# Patient Record
Sex: Male | Born: 1935 | Race: White | Hispanic: No | Marital: Married | Smoking: Former smoker
Health system: Southern US, Community
[De-identification: ages and names within clinical notes are randomized; demographics above are authoritative.]

## PROBLEM LIST (undated history)

## (undated) DIAGNOSIS — K219 Gastro-esophageal reflux disease without esophagitis: Secondary | ICD-10-CM

## (undated) DIAGNOSIS — K7460 Unspecified cirrhosis of liver: Secondary | ICD-10-CM

## (undated) DIAGNOSIS — I10 Essential (primary) hypertension: Secondary | ICD-10-CM

## (undated) DIAGNOSIS — E119 Type 2 diabetes mellitus without complications: Secondary | ICD-10-CM

## (undated) DIAGNOSIS — J45909 Unspecified asthma, uncomplicated: Secondary | ICD-10-CM

## (undated) DIAGNOSIS — E785 Hyperlipidemia, unspecified: Secondary | ICD-10-CM

## (undated) DIAGNOSIS — B1920 Unspecified viral hepatitis C without hepatic coma: Secondary | ICD-10-CM

## (undated) DIAGNOSIS — N189 Chronic kidney disease, unspecified: Secondary | ICD-10-CM

## (undated) HISTORY — DX: Unspecified asthma, uncomplicated: J45.909

## (undated) HISTORY — DX: Unspecified cirrhosis of liver: K74.60

## (undated) HISTORY — PX: COLONOSCOPY: SHX174

## (undated) HISTORY — DX: Chronic kidney disease, unspecified: N18.9

## (undated) HISTORY — DX: Gastro-esophageal reflux disease without esophagitis: K21.9

## (undated) HISTORY — DX: Hyperlipidemia, unspecified: E78.5

## (undated) HISTORY — DX: Essential (primary) hypertension: I10

## (undated) HISTORY — PX: OTHER SURGICAL HISTORY: SHX169

## (undated) HISTORY — DX: Unspecified viral hepatitis C without hepatic coma: B19.20

---

## 2004-12-05 ENCOUNTER — Encounter: Admission: RE | Admit: 2004-12-05 | Discharge: 2004-12-25 | Payer: Self-pay | Admitting: Orthopedic Surgery

## 2006-07-17 ENCOUNTER — Encounter: Payer: Self-pay | Admitting: Cardiovascular Disease

## 2006-08-12 ENCOUNTER — Encounter: Admission: RE | Admit: 2006-08-12 | Discharge: 2006-10-03 | Payer: Self-pay | Admitting: Orthopedic Surgery

## 2007-07-17 ENCOUNTER — Ambulatory Visit (HOSPITAL_COMMUNITY): Admission: RE | Admit: 2007-07-17 | Discharge: 2007-07-17 | Payer: Self-pay | Admitting: Internal Medicine

## 2007-10-07 ENCOUNTER — Ambulatory Visit (HOSPITAL_COMMUNITY): Admission: RE | Admit: 2007-10-07 | Discharge: 2007-10-08 | Payer: Self-pay | Admitting: Neurosurgery

## 2009-11-01 ENCOUNTER — Inpatient Hospital Stay (HOSPITAL_COMMUNITY): Admission: RE | Admit: 2009-11-01 | Discharge: 2009-11-04 | Payer: Self-pay | Admitting: Orthopedic Surgery

## 2009-11-28 ENCOUNTER — Encounter: Admission: RE | Admit: 2009-11-28 | Discharge: 2010-02-26 | Payer: Self-pay | Admitting: Orthopedic Surgery

## 2010-01-25 ENCOUNTER — Ambulatory Visit (HOSPITAL_COMMUNITY): Admission: RE | Admit: 2010-01-25 | Discharge: 2010-01-25 | Payer: Self-pay | Admitting: Neurosurgery

## 2010-02-08 DEATH — deceased

## 2010-03-09 ENCOUNTER — Inpatient Hospital Stay (HOSPITAL_COMMUNITY): Admission: RE | Admit: 2010-03-09 | Discharge: 2010-03-13 | Payer: Self-pay | Admitting: Neurosurgery

## 2010-08-23 LAB — BASIC METABOLIC PANEL
CO2: 26 mEq/L (ref 19–32)
Creatinine, Ser: 1.11 mg/dL (ref 0.4–1.5)
GFR calc Af Amer: >60 mL/min (ref >60)
Glucose, Bld: 149 mg/dL — ABNORMAL HIGH (ref 70–99)
Potassium: 4.2 mEq/L (ref 3.5–5.1)

## 2010-08-23 LAB — GLUCOSE, CAPILLARY
Glucose-Capillary: 132 mg/dL — ABNORMAL HIGH (ref 70–99)
Glucose-Capillary: 147 mg/dL — ABNORMAL HIGH (ref 70–99)
Glucose-Capillary: 170 mg/dL — ABNORMAL HIGH (ref 70–99)

## 2010-08-23 LAB — CBC
HCT: 41.6 % (ref 39.0–52.0)
Hemoglobin: 13.6 g/dL (ref 13.0–17.0)
MCH: 28.5 pg (ref 26.0–34.0)
MCHC: 32.7 g/dL (ref 30.0–36.0)
Platelets: 153 10*3/uL (ref 150–400)
RBC: 4.77 MIL/uL (ref 4.22–5.81)

## 2010-08-24 LAB — CREATININE, SERUM
Creatinine, Ser: 0.9 mg/dL (ref 0.4–1.5)
GFR calc Af Amer: >60 mL/min (ref >60)

## 2010-08-27 LAB — COMPREHENSIVE METABOLIC PANEL
ALT: 57 U/L — ABNORMAL HIGH (ref 0–53)
AST: 45 U/L — ABNORMAL HIGH (ref 0–37)
Albumin: 3.8 g/dL (ref 3.5–5.2)
Alkaline Phosphatase: 78 U/L (ref 39–117)
BUN: 16 mg/dL (ref 6–23)
Calcium: 9 mg/dL (ref 8.4–10.5)
Chloride: 104 mEq/L (ref 96–112)
GFR calc non Af Amer: >60 mL/min (ref >60)
Glucose, Bld: 129 mg/dL — ABNORMAL HIGH (ref 70–99)
Sodium: 137 mEq/L (ref 135–145)
Total Bilirubin: 0.8 mg/dL (ref 0.3–1.2)
Total Protein: 7 g/dL (ref 6.0–8.3)

## 2010-08-27 LAB — PROTIME-INR
INR: 1.09 (ref 0.00–1.49)
INR: 1.12 (ref 0.00–1.49)
Prothrombin Time: 14 seconds (ref 11.6–15.2)
Prothrombin Time: 14.3 seconds (ref 11.6–15.2)
Prothrombin Time: 16.6 seconds — ABNORMAL HIGH (ref 11.6–15.2)

## 2010-08-27 LAB — URINALYSIS, ROUTINE W REFLEX MICROSCOPIC
Bilirubin Urine: NEGATIVE
Hgb urine dipstick: NEGATIVE
Nitrite: NEGATIVE
Protein, ur: NEGATIVE mg/dL
Urobilinogen, UA: 0.2 mg/dL (ref 0.0–1.0)

## 2010-08-27 LAB — CBC
HCT: 32.9 % — ABNORMAL LOW (ref 39.0–52.0)
HCT: 35.5 % — ABNORMAL LOW (ref 39.0–52.0)
Hemoglobin: 11.2 g/dL — ABNORMAL LOW (ref 13.0–17.0)
Hemoglobin: 12.7 g/dL — ABNORMAL LOW (ref 13.0–17.0)
MCHC: 33.8 g/dL (ref 30.0–36.0)
MCHC: 33.9 g/dL (ref 30.0–36.0)
MCV: 92.9 fL (ref 78.0–100.0)
MCV: 92.9 fL (ref 78.0–100.0)
MCV: 93.8 fL (ref 78.0–100.0)
Platelets: 146 10*3/uL — ABNORMAL LOW (ref 150–400)
Platelets: 147 10*3/uL — ABNORMAL LOW (ref 150–400)
Platelets: 151 10*3/uL (ref 150–400)
RBC: 3.54 MIL/uL — ABNORMAL LOW (ref 4.22–5.81)
RBC: 4.02 MIL/uL — ABNORMAL LOW (ref 4.22–5.81)
RBC: 4.86 MIL/uL (ref 4.22–5.81)
RDW: 13.9 % (ref 11.5–15.5)
RDW: 14.1 % (ref 11.5–15.5)
WBC: 6.3 10*3/uL (ref 4.0–10.5)
WBC: 7.8 10*3/uL (ref 4.0–10.5)
WBC: 9.3 10*3/uL (ref 4.0–10.5)

## 2010-08-27 LAB — GLUCOSE, CAPILLARY
Glucose-Capillary: 119 mg/dL — ABNORMAL HIGH (ref 70–99)
Glucose-Capillary: 128 mg/dL — ABNORMAL HIGH (ref 70–99)
Glucose-Capillary: 136 mg/dL — ABNORMAL HIGH (ref 70–99)
Glucose-Capillary: 146 mg/dL — ABNORMAL HIGH (ref 70–99)
Glucose-Capillary: 152 mg/dL — ABNORMAL HIGH (ref 70–99)
Glucose-Capillary: 152 mg/dL — ABNORMAL HIGH (ref 70–99)
Glucose-Capillary: 165 mg/dL — ABNORMAL HIGH (ref 70–99)

## 2010-08-27 LAB — BASIC METABOLIC PANEL
BUN: 9 mg/dL (ref 6–23)
CO2: 31 mEq/L (ref 19–32)
CO2: 32 mEq/L (ref 19–32)
Calcium: 8.5 mg/dL (ref 8.4–10.5)
Chloride: 103 mEq/L (ref 96–112)
Chloride: 98 mEq/L (ref 96–112)
Creatinine, Ser: 1.06 mg/dL (ref 0.4–1.5)
Creatinine, Ser: 1.11 mg/dL (ref 0.4–1.5)
GFR calc Af Amer: >60 mL/min (ref >60)
Glucose, Bld: 153 mg/dL — ABNORMAL HIGH (ref 70–99)
Potassium: 4.2 mEq/L (ref 3.5–5.1)
Sodium: 139 mEq/L (ref 135–145)

## 2010-08-27 LAB — TYPE AND SCREEN
ABO/RH(D): O POS
Antibody Screen: NEGATIVE

## 2010-08-27 LAB — ABO/RH: ABO/RH(D): O POS

## 2010-10-23 NOTE — Op Note (Signed)
NAMEPHARAOH, PIO NO.:  [id number]   MEDICAL RECORD NO.:  [id number]          PATIENT TYPE:  INP   LOCATION:  3528                         FACILITY:  [facility]   PHYSICIAN:  Coletta Memos, M.D.     DATE OF BIRTH:  [birth year]   DATE OF PROCEDURE:  10/07/2007  DATE OF DISCHARGE:                               OPERATIVE REPORT   PREOPERATIVE DIAGNOSES:  1. Lumbar stenosis, L3-L4,  L4-L5.  2. Lumbar displaced disk, L3-L4.   POSTOPERATIVE DIAGNOSIS:  Lumbar stenosis, L3-L4, L4-L5.   PROCEDURE:  L4 laminectomy; hemilaminectomies L3; hemilaminectomy L5;  decompression of L3, L4, and L5 nerve roots.   COMPLICATIONS:  None.   SURGEON:  Coletta Memos, MD   ASSISTANT:  Hewitt Shorts, MD   INDICATIONS:  Mr. Meenach presented to my office yesterday in severe  pain.  He was being pushed in a wheelchair by his wife.  He had severe  stenosis at L3-L4 and L4-L5 and what I felt was a fragment of disk at L3-  L4 on the right side.  I therefore recommended and he agreed to go to  the operating room today.   OPERATIVE NOTE:  Mr. Hernon was brought to the operating room,  intubated, and placed under a general anesthetic without difficulty.  He  was rolled prone onto a Wilson frame and all pressure points were  properly padded.  His back was prepped and he was draped in a sterile  fashion.  I infiltrated 0.5% lidocaine with 1:200,000 strength of  epinephrine into the paraspinous musculature.  I opened the skin and  took the initial incision down through the subcutaneous fats to the  thoracolumbar fascia.  I exposed the lamina of the L2, L3, L4, and L5.  I took an intraoperative x-ray to confirm my location, then I was  actually underneath the L3 lamina, so I then exposed the L5 lamina.  I  then proceeded with a complete laminectomy of L4 removing very thick  ligamentum flavum.  I decompressed the spinal canal in the L4 and L5  roots.  I then performed hemilaminectomies of  L3 and of L5 further  decompressing the spinal canal and thecal sac.  I with Dr. Earl Gala  assistance then inspected the disk at L3-L4, searching for fragmented  disk.  He did not appreciate one nor did I appreciate any compression of  the nerve roots at the L3-L4 level.  I therefore achieved hemostasis.  I  then irrigated.  I closed the wound in layered fashion using Vicryl  sutures to reapproximate the thoracolumbar fascia, subcutaneous, and  subcuticular layers.           ______________________________  Coletta Memos, M.D.     KC/MEDQ  D:  10/07/2007  T:  10/08/2007  Job:  [id number]

## 2011-02-24 ENCOUNTER — Encounter: Payer: Self-pay | Admitting: Cardiovascular Disease

## 2011-03-05 LAB — CBC
Hemoglobin: 17
MCV: 91.3
RBC: 5.38
WBC: 8.1

## 2011-03-05 LAB — BASIC METABOLIC PANEL
Chloride: 98
Creatinine, Ser: 1.37
GFR calc Af Amer: >60
Sodium: 138

## 2011-03-05 LAB — HEPATIC FUNCTION PANEL
ALT: 48
Albumin: 3.9
Alkaline Phosphatase: 66
Total Bilirubin: 0.7
Total Protein: 7.3

## 2013-01-29 ENCOUNTER — Encounter: Payer: Self-pay | Admitting: Cardiology

## 2013-02-18 ENCOUNTER — Ambulatory Visit (INDEPENDENT_AMBULATORY_CARE_PROVIDER_SITE_OTHER): Payer: Medicare Other | Admitting: Cardiovascular Disease

## 2013-02-18 ENCOUNTER — Encounter: Payer: Self-pay | Admitting: *Deleted

## 2013-02-18 ENCOUNTER — Encounter: Payer: Self-pay | Admitting: Cardiovascular Disease

## 2013-02-18 VITALS — BP 135/80 | HR 78 | Ht 65.0 in | Wt 268.8 lb

## 2013-02-18 DIAGNOSIS — M7989 Other specified soft tissue disorders: Secondary | ICD-10-CM

## 2013-02-18 DIAGNOSIS — G4733 Obstructive sleep apnea (adult) (pediatric): Secondary | ICD-10-CM | POA: Insufficient documentation

## 2013-02-18 DIAGNOSIS — R0609 Other forms of dyspnea: Secondary | ICD-10-CM

## 2013-02-18 DIAGNOSIS — Z794 Long term (current) use of insulin: Secondary | ICD-10-CM

## 2013-02-18 DIAGNOSIS — I10 Essential (primary) hypertension: Secondary | ICD-10-CM

## 2013-02-18 DIAGNOSIS — J449 Chronic obstructive pulmonary disease, unspecified: Secondary | ICD-10-CM

## 2013-02-18 DIAGNOSIS — E119 Type 2 diabetes mellitus without complications: Secondary | ICD-10-CM

## 2013-02-18 DIAGNOSIS — R0602 Shortness of breath: Secondary | ICD-10-CM

## 2013-02-18 NOTE — Patient Instructions (Signed)
Your physician has requested that you have an echocardiogram. Echocardiography is a painless test that uses sound waves to create images of your heart. It provides your doctor with information about the size and shape of your heart and how well your heart's chambers and valves are working. This procedure takes approximately one hour. There are no restrictions for this procedure. Your physician has requested that you have a lexiscan myoview. For further information please visit [email or web address] Please follow instruction sheet, as given. Office will contact with results via phone or letter.   Continue all current medications. Follow up in  4-6 weeks

## 2013-02-18 NOTE — Progress Notes (Signed)
Patient ID: Cody Schmitt, male   DOB: 08-13-1934, 78 y.o.   MRN: [id number]    CARDIOLOGY CONSULT NOTE  Patient ID: Cody Schmitt MRN: [id number] DOB/AGE: 09-Sep-1934 78 y.o.  Primary Physician: Cody Specking, MD  Reason for Consultation: DOE and leg swelling  HPI: Mr. Cody Schmitt has a h/o COPD, IDDM, and OSA (uses CPAP), and has been having progressive dyspnea with exertion, with associated leg swelling. He had normal venous Dopplers in 2008 and had a positive sleep study in 2012, c/w COPD with obstructive sleep apnea. An ECG done in the office today is unremarkable.  He's been having increasing DOE since he put on weight. He's had bilateral knee replacement and two back surgeries and due to this, he's not had much activity. He denies chest pain. He recently started walking on a treadmill.  He denies a h/o heart disease. He denies lightheadedness and dizziness. He's only experienced one episode of palpitations while mowing the lawn, which resolved with rest.  He's begun using his nebulizer more in the last few weeks along with his inhaler, and he says his "breathing has improved 100%".   He appears to underestimate the significance of his SOB, as his wife has noticed significant shortness of breath when he walks.  I reviewed his recent blood tests, and TSH and Hgb were normal, with a creatinine of 1.28 and BUN of 19.   SocHx: married for 23 years, quit smoking in 1984.   Allergies  Allergen Reactions  . Coumadin [Warfarin Sodium]   . Dilaudid [Hydromorphone Hcl]   . Lisinopril   . Statins   . Zetia [Ezetimibe]     Current Outpatient Prescriptions  Medication Sig Dispense Refill  . albuterol (PROVENTIL) (2.5 MG/3ML) 0.083% nebulizer solution Take 2.5 mg by nebulization every 6 (six) hours as needed for wheezing.      Marland Kitchen alendronate (FOSAMAX) 70 MG tablet Take 70 mg by mouth every 7 (seven) days. Take with a full glass of water on an empty stomach.      Marland Kitchen amoxicillin (AMOXIL) 500  MG capsule Take 2,000 mg by mouth as needed. Take 4 tablets 1 hour prior to dental procedure      . aspirin 81 MG tablet Take 81 mg by mouth daily.      . clotrimazole-betamethasone (LOTRISONE) cream Apply topically 2 (two) times daily.      . diazepam (VALIUM) 5 MG tablet Take 5 mg by mouth every 6 (six) hours as needed for anxiety.      . diclofenac (VOLTAREN) 75 MG EC tablet Take 75 mg by mouth 2 (two) times daily.      . formoterol (FORADIL) 12 MCG capsule for inhaler Place 12 mcg into inhaler and inhale 2 (two) times daily.      . hydrochlorothiazide (HYDRODIURIL) 25 MG tablet Take 25 mg by mouth daily.      . insulin glargine (LANTUS) 100 UNIT/ML injection Inject 20 Units into the skin at bedtime.      Marland Kitchen ipratropium (ATROVENT HFA) 17 MCG/ACT inhaler Inhale 2 puffs into the lungs every 6 (six) hours.      Marland Kitchen ipratropium (ATROVENT) 0.02 % nebulizer solution Take 500 mcg by nebulization 4 (four) times daily.      Marland Kitchen loratadine (CLARITIN) 10 MG tablet Take 10 mg by mouth daily.      Marland Kitchen losartan (COZAAR) 100 MG tablet Take 100 mg by mouth daily.      . metFORMIN (GLUCOPHAGE) 500 MG tablet Take  500-1,000 mg by mouth 2 (two) times daily with a meal. Take 2 tablets in AM and 1 tablet in PM      . metoprolol succinate (TOPROL-XL) 25 MG 24 hr tablet Take 25 mg by mouth daily.      . mometasone (ASMANEX) 220 MCG/INH inhaler Inhale 2 puffs into the lungs daily.      . nalbuphine (NUBAIN) 20 MG/ML injection Inject into the vein every 3 (three) hours as needed.      . naproxen sodium (ANAPROX) 220 MG tablet Take 220 mg by mouth 2 (two) times daily with a meal.      . oxazepam (SERAX) 10 MG capsule Take 10 mg by mouth at bedtime as needed for sleep or anxiety.      . predniSONE (DELTASONE) 10 MG tablet Take 10 mg by mouth daily.      . promethazine (PHENERGAN) 25 MG/ML injection Inject into the vein once.      . QUININE SULFATE PO Take by mouth.      . tadalafil (CIALIS) 5 MG tablet Take 5 mg by mouth daily  as needed for erectile dysfunction.      . triamcinolone cream (KENALOG) 0.1 % Apply topically 2 (two) times daily.       No current facility-administered medications for this visit.    Past Medical History  Diagnosis Date  . HTN (hypertension)   . Asthma   . Hepatitis C   . Dyslipidemia   . GERD (gastroesophageal reflux disease)     Past Surgical History  Procedure Laterality Date  . Colonoscopy    . Cataract surgery      History   Social History  . Marital Status: Married    Spouse Name: N/A    Number of Children: N/A  . Years of Education: N/A   Occupational History  . Not on file.   Social History Main Topics  . Smoking status: Not on file  . Smokeless tobacco: Not on file  . Alcohol Use: Not on file  . Drug Use: Not on file  . Sexual Activity: Not on file   Other Topics Concern  . Not on file   Social History Narrative  . No narrative on file     FamHx: non-contributory  Review of systems complete and found to be negative unless listed above in HPI     Physical exam  BP: 135/80, HR: 78 bpm  General: NAD Neck: No JVD, no thyromegaly or thyroid nodule.  Lungs: Clear to auscultation bilaterally with normal respiratory effort. CV: Nondisplaced PMI.  Heart regular S1/S2, no S3/S4, no murmur.  No peripheral edema.  No carotid bruit.  Normal pedal pulses.  Abdomen: Soft, nontender, no hepatosplenomegaly, no distention.  Skin: Intact without lesions or rashes.  Neurologic: Alert and oriented x 3.  Psych: Normal affect. Extremities: No clubbing or cyanosis.  HEENT: Normal.   Labs:   Lab Results  Component Value Date   WBC 7.1 03/09/2010   HGB 13.6 03/09/2010   HCT 41.6 03/09/2010   MCV 87.2 03/09/2010   PLT 153 03/09/2010   No results found for this basename: NA, K, CL, CO2, BUN, CREATININE, CALCIUM, LABALBU, PROT, BILITOT, ALKPHOS, ALT, AST, GLUCOSE,  in the last 168 hours No results found for this basename: CKTOTAL, CKMB, CKMBINDEX, TROPONINI     No results found for this basename: CHOL   No results found for this basename: HDL   No results found for this basename: LDLCALC  No results found for this basename: TRIG   No results found for this basename: CHOLHDL   No results found for this basename: LDLDIRECT       EKG: Sinus rhythm, rate 78 bpm, axis within normal limits, intervals within normal limits, no acute ST-T wave changes.  Studies:   ASSESSMENT AND PLAN:  1. Dyspnea on exertion: given that this has appeared to have progressed (at least per his wife), and with his risk factors being HTN, hyperlipidemia, and IDDM, I will obtain an echocardiogram to evaluate for structural heart disease, and a Lexiscan Myoview stress test to evaluate for occult ischemia, to see if this represents an anginal equivalent. 2. HTN: controlled on HCTZ. 3. Hyperlipidemia: on red rice yeast extract.  Signed: Prentice Docker, M.D., F.A.C.C. 02/18/2013, 1:30 PM

## 2013-02-19 ENCOUNTER — Telehealth: Payer: Self-pay | Admitting: Cardiovascular Disease

## 2013-02-19 NOTE — Telephone Encounter (Signed)
Cody Schmitt wants to know about stopping his ASA before his stress test.

## 2013-02-19 NOTE — Telephone Encounter (Signed)
Patient advised that he can take his ASA.  Hold Insulin & Metformin morning of test as previously instructed.  Patient verbalized understanding.

## 2013-02-25 ENCOUNTER — Other Ambulatory Visit (INDEPENDENT_AMBULATORY_CARE_PROVIDER_SITE_OTHER): Payer: Medicare Other

## 2013-02-25 ENCOUNTER — Other Ambulatory Visit: Payer: Self-pay

## 2013-02-25 DIAGNOSIS — R0609 Other forms of dyspnea: Secondary | ICD-10-CM

## 2013-02-25 DIAGNOSIS — M7989 Other specified soft tissue disorders: Secondary | ICD-10-CM

## 2013-02-25 DIAGNOSIS — I10 Essential (primary) hypertension: Secondary | ICD-10-CM

## 2013-02-25 DIAGNOSIS — R0602 Shortness of breath: Secondary | ICD-10-CM

## 2013-03-02 ENCOUNTER — Encounter (HOSPITAL_COMMUNITY)
Admission: RE | Admit: 2013-03-02 | Discharge: 2013-03-02 | Disposition: A | Discharge status: Home / Self Care | Payer: Medicare Other | Source: Ambulatory Visit | Attending: Cardiovascular Disease | Admitting: Cardiovascular Disease

## 2013-03-02 ENCOUNTER — Encounter (HOSPITAL_COMMUNITY): Payer: Self-pay

## 2013-03-02 DIAGNOSIS — I10 Essential (primary) hypertension: Secondary | ICD-10-CM | POA: Insufficient documentation

## 2013-03-02 DIAGNOSIS — M7989 Other specified soft tissue disorders: Secondary | ICD-10-CM

## 2013-03-02 DIAGNOSIS — R0602 Shortness of breath: Secondary | ICD-10-CM | POA: Insufficient documentation

## 2013-03-02 DIAGNOSIS — R0989 Other specified symptoms and signs involving the circulatory and respiratory systems: Secondary | ICD-10-CM | POA: Insufficient documentation

## 2013-03-02 DIAGNOSIS — E119 Type 2 diabetes mellitus without complications: Secondary | ICD-10-CM | POA: Insufficient documentation

## 2013-03-02 DIAGNOSIS — Z794 Long term (current) use of insulin: Secondary | ICD-10-CM | POA: Insufficient documentation

## 2013-03-02 DIAGNOSIS — R0609 Other forms of dyspnea: Secondary | ICD-10-CM

## 2013-03-02 HISTORY — DX: Type 2 diabetes mellitus without complications: E11.9

## 2013-03-02 MED ORDER — TECHNETIUM TC 99M SESTAMIBI - CARDIOLITE
10.0000 | Freq: Once | INTRAVENOUS | Status: AC | PRN
  Administered 2013-03-02: 10 via INTRAVENOUS

## 2013-03-02 MED ORDER — SODIUM CHLORIDE 0.9 % IJ SOLN
INTRAMUSCULAR | Status: AC
  Administered 2013-03-02: 10 mL via INTRAVENOUS
  Filled 2013-03-02: qty 10

## 2013-03-02 MED ORDER — REGADENOSON 0.4 MG/5ML IV SOLN
INTRAVENOUS | Status: AC
  Administered 2013-03-02: 0.4 mg via INTRAVENOUS
  Filled 2013-03-02: qty 5

## 2013-03-02 MED ORDER — TECHNETIUM TC 99M SESTAMIBI - CARDIOLITE
30.0000 | Freq: Once | INTRAVENOUS | Status: AC | PRN
  Administered 2013-03-02: 11:00:00 30 via INTRAVENOUS

## 2013-03-02 NOTE — Progress Notes (Signed)
Stress Lab Nurses Notes - Cody Schmitt  Cody Schmitt 03/02/2013 Reason for doing test: Dyspnea Type of test: Marlane Hatcher Nurse performing test: Parke Poisson, RN Nuclear Medicine Tech: Lyndel Pleasure Echo Tech: Not Applicable MD performing test: Dr. Purvis Sheffield / Joni Reining NP Family MD: Dr. Sherril Croon Test explained and consent signed: yes IV started: 22g jelco, Saline lock flushed, No redness or edema and Saline lock started in radiology Symptoms: SOB & stomach discomfort Treatment/Intervention: None Reason test stopped: protocol completed After recovery IV was: Discontinued via X-ray tech and No redness or edema Patient to return to Nuc. Med at : 11:45 Patient discharged: Home Patient's Condition upon discharge was: stable Comments: During test BP 162/58 & HR 92.   Recovery BP 147/62 & HR 81 .  Symptoms resolved in recovery. Erskine Speed T

## 2013-03-03 ENCOUNTER — Telehealth: Payer: Self-pay | Admitting: *Deleted

## 2013-03-03 NOTE — Telephone Encounter (Signed)
STRESS TEST --  Notes Recorded by Laqueta Linden, MD on 03/03/2013 at 8:56 AM Please inform pt of normal results.  ECHO --  Notes Recorded by Laqueta Linden, MD on 02/26/2013 at 4:13 PM Await results of stress test and will discuss at OV.  That's ok. Can f/u with PCP. Stress test was low-risk and he has normal LV systolic function. Likely lungs causing his shortness of breath. -----   Message ----- From: Lesle Chris, LPN Sent: 0/98/1191 4:05 PM To: Laqueta Linden, MD   When do you want this patient to be scheduled for follow up? Did not have a visit scheduled.

## 2013-03-03 NOTE — Telephone Encounter (Signed)
Patient notified of Echo & stress test.  Will forward copy to PMD (Vyas).

## 2014-10-10 ENCOUNTER — Telehealth: Payer: Self-pay | Admitting: Internal Medicine

## 2014-10-10 ENCOUNTER — Inpatient Hospital Stay (HOSPITAL_COMMUNITY): Payer: Medicare Other

## 2014-10-10 ENCOUNTER — Inpatient Hospital Stay (HOSPITAL_COMMUNITY)
Admission: AD | Admit: 2014-10-10 | Discharge: 2014-10-28 | DRG: 871 | Disposition: A | Discharge status: Skilled Nursing Facility | Payer: Medicare Other | Source: Other Acute Inpatient Hospital | Attending: Internal Medicine | Admitting: Internal Medicine

## 2014-10-10 DIAGNOSIS — A419 Sepsis, unspecified organism: Secondary | ICD-10-CM | POA: Diagnosis present

## 2014-10-10 DIAGNOSIS — I129 Hypertensive chronic kidney disease with stage 1 through stage 4 chronic kidney disease, or unspecified chronic kidney disease: Secondary | ICD-10-CM | POA: Diagnosis not present

## 2014-10-10 DIAGNOSIS — D649 Anemia, unspecified: Secondary | ICD-10-CM | POA: Diagnosis present

## 2014-10-10 DIAGNOSIS — Z794 Long term (current) use of insulin: Secondary | ICD-10-CM | POA: Diagnosis not present

## 2014-10-10 DIAGNOSIS — J449 Chronic obstructive pulmonary disease, unspecified: Secondary | ICD-10-CM | POA: Diagnosis not present

## 2014-10-10 DIAGNOSIS — J45909 Unspecified asthma, uncomplicated: Secondary | ICD-10-CM | POA: Diagnosis not present

## 2014-10-10 DIAGNOSIS — Z8639 Personal history of other endocrine, nutritional and metabolic disease: Secondary | ICD-10-CM | POA: Diagnosis not present

## 2014-10-10 DIAGNOSIS — G9340 Encephalopathy, unspecified: Secondary | ICD-10-CM | POA: Diagnosis present

## 2014-10-10 DIAGNOSIS — I248 Other forms of acute ischemic heart disease: Secondary | ICD-10-CM | POA: Diagnosis present

## 2014-10-10 DIAGNOSIS — Z87891 Personal history of nicotine dependence: Secondary | ICD-10-CM | POA: Diagnosis not present

## 2014-10-10 DIAGNOSIS — R443 Hallucinations, unspecified: Secondary | ICD-10-CM | POA: Diagnosis not present

## 2014-10-10 DIAGNOSIS — J690 Pneumonitis due to inhalation of food and vomit: Secondary | ICD-10-CM | POA: Diagnosis not present

## 2014-10-10 DIAGNOSIS — R109 Unspecified abdominal pain: Secondary | ICD-10-CM

## 2014-10-10 DIAGNOSIS — R451 Restlessness and agitation: Secondary | ICD-10-CM | POA: Diagnosis present

## 2014-10-10 DIAGNOSIS — E785 Hyperlipidemia, unspecified: Secondary | ICD-10-CM | POA: Diagnosis not present

## 2014-10-10 DIAGNOSIS — K219 Gastro-esophageal reflux disease without esophagitis: Secondary | ICD-10-CM | POA: Diagnosis not present

## 2014-10-10 DIAGNOSIS — Z888 Allergy status to other drugs, medicaments and biological substances status: Secondary | ICD-10-CM

## 2014-10-10 DIAGNOSIS — E874 Mixed disorder of acid-base balance: Secondary | ICD-10-CM | POA: Diagnosis present

## 2014-10-10 DIAGNOSIS — R4182 Altered mental status, unspecified: Secondary | ICD-10-CM | POA: Diagnosis present

## 2014-10-10 DIAGNOSIS — M7989 Other specified soft tissue disorders: Secondary | ICD-10-CM | POA: Diagnosis not present

## 2014-10-10 DIAGNOSIS — E876 Hypokalemia: Secondary | ICD-10-CM | POA: Diagnosis not present

## 2014-10-10 DIAGNOSIS — Z885 Allergy status to narcotic agent status: Secondary | ICD-10-CM | POA: Diagnosis not present

## 2014-10-10 DIAGNOSIS — I10 Essential (primary) hypertension: Secondary | ICD-10-CM | POA: Diagnosis not present

## 2014-10-10 DIAGNOSIS — E870 Hyperosmolality and hypernatremia: Secondary | ICD-10-CM | POA: Diagnosis not present

## 2014-10-10 DIAGNOSIS — R579 Shock, unspecified: Secondary | ICD-10-CM | POA: Diagnosis not present

## 2014-10-10 DIAGNOSIS — E1142 Type 2 diabetes mellitus with diabetic polyneuropathy: Secondary | ICD-10-CM | POA: Diagnosis not present

## 2014-10-10 DIAGNOSIS — Z79899 Other long term (current) drug therapy: Secondary | ICD-10-CM | POA: Diagnosis not present

## 2014-10-10 DIAGNOSIS — R410 Disorientation, unspecified: Secondary | ICD-10-CM | POA: Diagnosis not present

## 2014-10-10 DIAGNOSIS — R609 Edema, unspecified: Secondary | ICD-10-CM

## 2014-10-10 DIAGNOSIS — R6520 Severe sepsis without septic shock: Secondary | ICD-10-CM | POA: Diagnosis not present

## 2014-10-10 DIAGNOSIS — K760 Fatty (change of) liver, not elsewhere classified: Secondary | ICD-10-CM | POA: Diagnosis not present

## 2014-10-10 DIAGNOSIS — R5381 Other malaise: Secondary | ICD-10-CM

## 2014-10-10 DIAGNOSIS — Z452 Encounter for adjustment and management of vascular access device: Secondary | ICD-10-CM

## 2014-10-10 DIAGNOSIS — J9601 Acute respiratory failure with hypoxia: Secondary | ICD-10-CM | POA: Diagnosis not present

## 2014-10-10 DIAGNOSIS — Z7982 Long term (current) use of aspirin: Secondary | ICD-10-CM | POA: Diagnosis not present

## 2014-10-10 DIAGNOSIS — K7290 Hepatic failure, unspecified without coma: Secondary | ICD-10-CM | POA: Diagnosis not present

## 2014-10-10 DIAGNOSIS — G4733 Obstructive sleep apnea (adult) (pediatric): Secondary | ICD-10-CM | POA: Diagnosis not present

## 2014-10-10 DIAGNOSIS — N183 Chronic kidney disease, stage 3 unspecified: Secondary | ICD-10-CM | POA: Diagnosis present

## 2014-10-10 DIAGNOSIS — J810 Acute pulmonary edema: Secondary | ICD-10-CM | POA: Diagnosis not present

## 2014-10-10 DIAGNOSIS — N179 Acute kidney failure, unspecified: Secondary | ICD-10-CM | POA: Diagnosis not present

## 2014-10-10 DIAGNOSIS — IMO0001 Reserved for inherently not codable concepts without codable children: Secondary | ICD-10-CM

## 2014-10-10 DIAGNOSIS — R6521 Severe sepsis with septic shock: Secondary | ICD-10-CM | POA: Diagnosis present

## 2014-10-10 DIAGNOSIS — J9600 Acute respiratory failure, unspecified whether with hypoxia or hypercapnia: Secondary | ICD-10-CM | POA: Diagnosis not present

## 2014-10-10 DIAGNOSIS — D696 Thrombocytopenia, unspecified: Secondary | ICD-10-CM | POA: Diagnosis not present

## 2014-10-10 DIAGNOSIS — J9811 Atelectasis: Secondary | ICD-10-CM

## 2014-10-10 DIAGNOSIS — E119 Type 2 diabetes mellitus without complications: Secondary | ICD-10-CM

## 2014-10-10 DIAGNOSIS — Z4659 Encounter for fitting and adjustment of other gastrointestinal appliance and device: Secondary | ICD-10-CM

## 2014-10-10 DIAGNOSIS — R0600 Dyspnea, unspecified: Secondary | ICD-10-CM | POA: Diagnosis not present

## 2014-10-10 DIAGNOSIS — R0609 Other forms of dyspnea: Secondary | ICD-10-CM

## 2014-10-10 DIAGNOSIS — R768 Other specified abnormal immunological findings in serum: Secondary | ICD-10-CM

## 2014-10-10 DIAGNOSIS — J9690 Respiratory failure, unspecified, unspecified whether with hypoxia or hypercapnia: Secondary | ICD-10-CM | POA: Diagnosis present

## 2014-10-10 DIAGNOSIS — Z978 Presence of other specified devices: Secondary | ICD-10-CM

## 2014-10-10 LAB — PROTIME-INR
INR: 1.44 (ref 0.00–1.49)
PROTHROMBIN TIME: 17.7 s — AB (ref 11.6–15.2)

## 2014-10-10 LAB — RAPID URINE DRUG SCREEN, HOSP PERFORMED
Amphetamines: NOT DETECTED
Barbiturates: NOT DETECTED
Benzodiazepines: POSITIVE — AB
Cocaine: NOT DETECTED
Opiates: NOT DETECTED
Tetrahydrocannabinol: NOT DETECTED

## 2014-10-10 LAB — CBC WITH DIFFERENTIAL/PLATELET
BASOS PCT: 0 % (ref 0–1)
Basophils Absolute: 0 10*3/uL (ref 0.0–0.1)
EOS ABS: 0 10*3/uL (ref 0.0–0.7)
Eosinophils Relative: 0 % (ref 0–5)
HCT: 27.7 % — ABNORMAL LOW (ref 39.0–52.0)
Hemoglobin: 9.1 g/dL — ABNORMAL LOW (ref 13.0–17.0)
Lymphocytes Relative: 10 % — ABNORMAL LOW (ref 12–46)
Lymphs Abs: 1.1 10*3/uL (ref 0.7–4.0)
MCH: 27.9 pg (ref 26.0–34.0)
MCHC: 32.9 g/dL (ref 30.0–36.0)
MCV: 85 fL (ref 78.0–100.0)
MONOS PCT: 12 % (ref 3–12)
Monocytes Absolute: 1.4 10*3/uL — ABNORMAL HIGH (ref 0.1–1.0)
Neutro Abs: 8.8 10*3/uL — ABNORMAL HIGH (ref 1.7–7.7)
Neutrophils Relative %: 78 % — ABNORMAL HIGH (ref 43–77)
PLATELETS: 84 10*3/uL — AB (ref 150–400)
RBC: 3.26 MIL/uL — ABNORMAL LOW (ref 4.22–5.81)
RDW: 16 % — AB (ref 11.5–15.5)
WBC: 11.3 10*3/uL — ABNORMAL HIGH (ref 4.0–10.5)

## 2014-10-10 LAB — LACTIC ACID, PLASMA: LACTIC ACID, VENOUS: 1.5 mmol/L (ref 0.5–2.0)

## 2014-10-10 LAB — BRAIN NATRIURETIC PEPTIDE: B NATRIURETIC PEPTIDE 5: 287.5 pg/mL — AB (ref 0.0–100.0)

## 2014-10-10 LAB — LIPASE, BLOOD: Lipase: 21 U/L — ABNORMAL LOW (ref 22–51)

## 2014-10-10 LAB — COMPREHENSIVE METABOLIC PANEL
ALBUMIN: 2.3 g/dL — AB (ref 3.5–5.0)
ALK PHOS: 114 U/L (ref 38–126)
ALT: 40 U/L (ref 17–63)
ANION GAP: 5 (ref 5–15)
AST: 62 U/L — ABNORMAL HIGH (ref 15–41)
BILIRUBIN TOTAL: 1.5 mg/dL — AB (ref 0.3–1.2)
BUN: 24 mg/dL — AB (ref 6–20)
CALCIUM: 8.1 mg/dL — AB (ref 8.9–10.3)
CO2: 25 mmol/L (ref 22–32)
Chloride: 106 mmol/L (ref 101–111)
Creatinine, Ser: 1.53 mg/dL — ABNORMAL HIGH (ref 0.61–1.24)
GFR calc non Af Amer: 42 mL/min — ABNORMAL LOW (ref >60)
GFR, EST AFRICAN AMERICAN: 49 mL/min — AB (ref >60)
Glucose, Bld: 143 mg/dL — ABNORMAL HIGH (ref 70–99)
POTASSIUM: 3.8 mmol/L (ref 3.5–5.1)
Sodium: 136 mmol/L (ref 135–145)
Total Protein: 5.7 g/dL — ABNORMAL LOW (ref 6.5–8.1)

## 2014-10-10 LAB — MAGNESIUM: MAGNESIUM: 1.6 mg/dL — AB (ref 1.7–2.4)

## 2014-10-10 LAB — URINALYSIS, ROUTINE W REFLEX MICROSCOPIC
Bilirubin Urine: NEGATIVE
GLUCOSE, UA: NEGATIVE mg/dL
KETONES UR: NEGATIVE mg/dL
Nitrite: NEGATIVE
PROTEIN: 100 mg/dL — AB
Specific Gravity, Urine: 1.019 (ref 1.005–1.030)
Urobilinogen, UA: 0.2 mg/dL (ref 0.0–1.0)
pH: 5.5 (ref 5.0–8.0)

## 2014-10-10 LAB — PHOSPHORUS: Phosphorus: 3.3 mg/dL (ref 2.5–4.6)

## 2014-10-10 LAB — CORTISOL: Cortisol, Plasma: 20.8 ug/dL

## 2014-10-10 LAB — ACETAMINOPHEN LEVEL: Acetaminophen (Tylenol), Serum: <10 ug/mL — ABNORMAL LOW (ref 10–30)

## 2014-10-10 LAB — SALICYLATE LEVEL

## 2014-10-10 LAB — TROPONIN I
TROPONIN I: 0.13 ng/mL — AB (ref <0.031)
Troponin I: 0.13 ng/mL — ABNORMAL HIGH (ref <0.031)

## 2014-10-10 LAB — TSH: TSH: 1.156 u[IU]/mL (ref 0.350–4.500)

## 2014-10-10 LAB — AMMONIA: Ammonia: 50 umol/L — ABNORMAL HIGH (ref 9–35)

## 2014-10-10 LAB — OSMOLALITY, URINE: OSMOLALITY UR: 482 mosm/kg (ref 390–1090)

## 2014-10-10 LAB — GLUCOSE, CAPILLARY
Glucose-Capillary: 177 mg/dL — ABNORMAL HIGH (ref 70–99)
Glucose-Capillary: 145 mg/dL — ABNORMAL HIGH (ref 70–99)

## 2014-10-10 LAB — AMYLASE: Amylase: 28 U/L (ref 28–100)

## 2014-10-10 LAB — URINE MICROSCOPIC-ADD ON

## 2014-10-10 LAB — MRSA PCR SCREENING: MRSA by PCR: NEGATIVE

## 2014-10-10 LAB — PROCALCITONIN: Procalcitonin: 1.97 ng/mL

## 2014-10-10 MED ORDER — SODIUM CHLORIDE 0.9 % IV BOLUS (SEPSIS): 500.0000 mL | INTRAVENOUS | Status: DC | PRN

## 2014-10-10 MED ORDER — DEXTROSE 5 % IV SOLN
2.0000 g | Freq: Once | INTRAVENOUS | Status: DC
  Filled 2014-10-10: qty 2

## 2014-10-10 MED ORDER — METOPROLOL TARTRATE 1 MG/ML IV SOLN: 2.5000 mg | INTRAVENOUS | Status: DC | PRN

## 2014-10-10 MED ORDER — NOREPINEPHRINE BITARTRATE 1 MG/ML IV SOLN
0.0000 ug/min | INTRAVENOUS | Status: DC
  Administered 2014-10-10: 10 ug/min via INTRAVENOUS
  Administered 2014-10-12: 2 ug/min via INTRAVENOUS
  Filled 2014-10-10 (×2): qty 16

## 2014-10-10 MED ORDER — DEXTROSE 5 % IV SOLN
2.0000 g | INTRAVENOUS | Status: DC
  Administered 2014-10-10 – 2014-10-13 (×4): 2 g via INTRAVENOUS
  Filled 2014-10-10 (×5): qty 2

## 2014-10-10 MED ORDER — ARFORMOTEROL TARTRATE 15 MCG/2ML IN NEBU
15.0000 ug | INHALATION_SOLUTION | Freq: Two times a day (BID) | RESPIRATORY_TRACT | Status: DC
  Administered 2014-10-10 – 2014-10-28 (×35): 15 ug via RESPIRATORY_TRACT
  Filled 2014-10-10 (×43): qty 2

## 2014-10-10 MED ORDER — HEPARIN SODIUM (PORCINE) 5000 UNIT/ML IJ SOLN
5000.0000 [IU] | Freq: Three times a day (TID) | INTRAMUSCULAR | Status: DC
  Administered 2014-10-10 – 2014-10-28 (×53): 5000 [IU] via SUBCUTANEOUS
  Filled 2014-10-10 (×59): qty 1

## 2014-10-10 MED ORDER — DEXTROSE 5 % IV SOLN
10.0000 mg/kg | Freq: Three times a day (TID) | INTRAVENOUS | Status: DC
  Administered 2014-10-10 – 2014-10-11 (×2): 615 mg via INTRAVENOUS
  Filled 2014-10-10 (×4): qty 12.3

## 2014-10-10 MED ORDER — INSULIN ASPART 100 UNIT/ML ~~LOC~~ SOLN
2.0000 [IU] | SUBCUTANEOUS | Status: DC
  Administered 2014-10-10 – 2014-10-11 (×4): 2 [IU] via SUBCUTANEOUS
  Administered 2014-10-11 (×2): 4 [IU] via SUBCUTANEOUS
  Administered 2014-10-11 – 2014-10-12 (×2): 2 [IU] via SUBCUTANEOUS
  Administered 2014-10-12: 4 [IU] via SUBCUTANEOUS
  Administered 2014-10-12 (×2): 2 [IU] via SUBCUTANEOUS
  Administered 2014-10-12: 4 [IU] via SUBCUTANEOUS
  Administered 2014-10-12: 2 [IU] via SUBCUTANEOUS
  Administered 2014-10-13: 4 [IU] via SUBCUTANEOUS
  Administered 2014-10-13: 2 [IU] via SUBCUTANEOUS
  Administered 2014-10-13 – 2014-10-14 (×6): 4 [IU] via SUBCUTANEOUS
  Administered 2014-10-14: 2 [IU] via SUBCUTANEOUS

## 2014-10-10 MED ORDER — LORAZEPAM 2 MG/ML IJ SOLN
1.0000 mg | INTRAMUSCULAR | Status: DC | PRN
  Administered 2014-10-10 – 2014-10-13 (×7): 2 mg via INTRAVENOUS
  Filled 2014-10-10 (×7): qty 1

## 2014-10-10 MED ORDER — BUDESONIDE 0.25 MG/2ML IN SUSP
0.2500 mg | Freq: Two times a day (BID) | RESPIRATORY_TRACT | Status: DC
  Administered 2014-10-10 – 2014-10-28 (×34): 0.25 mg via RESPIRATORY_TRACT
  Filled 2014-10-10 (×39): qty 2

## 2014-10-10 MED ORDER — SODIUM CHLORIDE 0.9 % IV SOLN
INTRAVENOUS | Status: DC
  Administered 2014-10-10: 17:00:00 via INTRAVENOUS

## 2014-10-10 MED ORDER — LORAZEPAM 2 MG/ML PO CONC
1.0000 mg | ORAL | Status: DC | PRN
  Filled 2014-10-10: qty 1

## 2014-10-10 MED ORDER — HYDRALAZINE HCL 20 MG/ML IJ SOLN: 10.0000 mg | INTRAMUSCULAR | Status: DC | PRN

## 2014-10-10 MED ORDER — CETYLPYRIDINIUM CHLORIDE 0.05 % MT LIQD
7.0000 mL | Freq: Two times a day (BID) | OROMUCOSAL | Status: DC
  Administered 2014-10-11 – 2014-10-12 (×4): 7 mL via OROMUCOSAL

## 2014-10-10 MED ORDER — CHLORHEXIDINE GLUCONATE 0.12 % MT SOLN
15.0000 mL | Freq: Two times a day (BID) | OROMUCOSAL | Status: DC
  Administered 2014-10-10 – 2014-10-13 (×6): 15 mL via OROMUCOSAL
  Filled 2014-10-10 (×6): qty 15

## 2014-10-10 MED ORDER — SODIUM CHLORIDE 0.9 % IV SOLN
INTRAVENOUS | Status: DC
  Administered 2014-10-10 – 2014-10-13 (×3): via INTRAVENOUS

## 2014-10-10 MED ORDER — PANTOPRAZOLE SODIUM 40 MG IV SOLR
40.0000 mg | Freq: Every day | INTRAVENOUS | Status: DC
  Administered 2014-10-10 – 2014-10-12 (×3): 40 mg via INTRAVENOUS
  Filled 2014-10-10 (×5): qty 40

## 2014-10-10 MED ORDER — VANCOMYCIN HCL 10 G IV SOLR
1500.0000 mg | INTRAVENOUS | Status: DC
  Administered 2014-10-10 – 2014-10-17 (×7): 1500 mg via INTRAVENOUS
  Filled 2014-10-10 (×7): qty 1500

## 2014-10-10 MED ORDER — DEXMEDETOMIDINE HCL IN NACL 400 MCG/100ML IV SOLN
0.0000 ug/kg/h | INTRAVENOUS | Status: DC
  Administered 2014-10-10: 0.2 ug/kg/h via INTRAVENOUS
  Administered 2014-10-10: 0.4 ug/kg/h via INTRAVENOUS
  Administered 2014-10-10: 0.8 ug/kg/h via INTRAVENOUS
  Administered 2014-10-11: 0.7 ug/kg/h via INTRAVENOUS
  Administered 2014-10-11: 0.4 ug/kg/h via INTRAVENOUS
  Administered 2014-10-11: 0.2 ug/kg/h via INTRAVENOUS
  Administered 2014-10-11: 0.8 ug/kg/h via INTRAVENOUS
  Administered 2014-10-11: 0.4 ug/kg/h via INTRAVENOUS
  Administered 2014-10-11 (×2): 0.8 ug/kg/h via INTRAVENOUS
  Administered 2014-10-11: 0.3 ug/kg/h via INTRAVENOUS
  Administered 2014-10-11: 0.1 ug/kg/h via INTRAVENOUS
  Administered 2014-10-12 (×3): 0.8 ug/kg/h via INTRAVENOUS
  Filled 2014-10-10: qty 100
  Filled 2014-10-10: qty 50
  Filled 2014-10-10: qty 200
  Filled 2014-10-10 (×3): qty 100
  Filled 2014-10-10: qty 50
  Filled 2014-10-10: qty 100
  Filled 2014-10-10: qty 50
  Filled 2014-10-10: qty 100

## 2014-10-10 NOTE — Telephone Encounter (Signed)
  PENDING ACCEPTANCE TRANFER NOTE:  [facility]    [facility], Dr. Sherril CroonVyas  REASON FOR REQUESTING TRANSFER:    Sepsis, likely needs ID consult  CC: Sepsis, AMS   HPI:   80 yo morbidly obese male [facility] with fever, chills, AMS. Temp was 102F. Was not hypotensive, UA, CXR were negative. LP was not done due to body habitus. Placed on zosyn and vancomycin and acyclovir. [facility] and ID consultation. Pt currently confused, sometimes agitated. ABG Ph7.48,PCO234,PO258. Last vitals BP 124/55, RR23, Temp100.6, O2 93% on 2L.    PLAN:  According to telephone report, [facility] under [facility] team:  [facility],  I have requested an order be written to call Flow Manager at [phone] upon patient arrival to the floor for final physician assignment who will do the admission and give admitting orders.  SIGNED: Clint LippsELMAHI,Kaley Jutras A, MD [facility]  10/10/2014, 10:12 AM

## 2014-10-10 NOTE — Procedures (Signed)
PROCEDURE NOTE: [place] CVL PLACEMENT  INDICATION:    Monitoring of central venous pressures and/or administration of medications optimally administered in central vein  CONSENT:   Implied consent, urgent procedure. A time out was performed.   PROCEDURE  Sterile technique was used including antiseptics, cap, gloves, gown, hand hygiene, mask and full body sheet.  Skin prep: Chlorhexidine; local anesthetic administered  A triple lumen catheter was placed in the [place] vein using the Seldinger technique.   EVALUATION:  Blood flow good  Complications: No apparent complications  Patient tolerated the procedure well.  Chest X-ray ordered to verify placement and is pending   Billy Fischeravid Simonds, MD PCCM service Mobile [phone]

## 2014-10-10 NOTE — Progress Notes (Addendum)
ANTIBIOTIC CONSULT NOTE - INITIAL  Pharmacy Consult for vancomycin, cefepime and acyclovir Indication: rule out sepsis and meningitis  Allergies  Allergen Reactions  . Coumadin [Warfarin Sodium]   . Dilaudid [Hydromorphone Hcl]   . Lisinopril   . Statins   . Zetia [Ezetimibe]     Patient Measurements: Height: 5' 4.96" (165 cm) Weight: 282 lb 3 oz (128 kg) IBW/kg (Calculated) : 61.41 Adjusted Body Weight:   Vital Signs: Temp: 99 F (37.2 C) (05/02 1552) Temp Source: Oral (05/02 1552) Pulse Rate: 127 (05/02 1730) Intake/Output from previous day:   Intake/Output from this shift: Total I/O In: -  Out: 175 [Urine:175]  Labs: No results for input(s): WBC, HGB, PLT, LABCREA, CREATININE in the last 72 hours. CrCl cannot be calculated (Patient has no serum creatinine result on file.). No results for input(s): VANCOTROUGH, VANCOPEAK, VANCORANDOM, GENTTROUGH, GENTPEAK, GENTRANDOM, TOBRATROUGH, TOBRAPEAK, TOBRARND, AMIKACINPEAK, AMIKACINTROU, AMIKACIN in the last 72 hours.   Microbiology: No results found for this or any previous visit (from the past 720 hour(s)).  Medical History: Past Medical History  Diagnosis Date  . HTN (hypertension)   . Asthma   . Hepatitis C   . Dyslipidemia   . GERD (gastroesophageal reflux disease)   . Diabetes mellitus without complication     Medications:  Scheduled:  . arformoterol  15 mcg Nebulization BID  . budesonide  0.25 mg Nebulization BID  . heparin  5,000 Units Subcutaneous 3 times per day  . insulin aspart  2-6 Units Subcutaneous 6 times per day  . pantoprazole (PROTONIX) IV  40 mg Intravenous QHS   Infusions:  . sodium chloride 75 mL/hr at 10/10/14 1706   Assessment: 80 yo male with r/o sepsis and meningitis will be continued on acylcovir and vancomycin but also adding cefepime.  Patient got a dose of zosyn, vancomycin and acylovir while @ [facility].  Last vancomycin dose was 1500 mg at ~ midnight on 05/02 and acyclovir was  1000 mg @ 0843 on 05/02.  [facility] (CrCl ~32.6 ml/min)  Goal of Therapy:  Vancomycin trough 15-20 and resolution of infection  Plan:  - Cefepime 2g iv q24h; vancomycin 1500 mg iv q24h (next dose @ midnight) and acyclovir 615 mg iv q8h - follow up renal function - check vancomycin trough when it's appropriate  Demya Scruggs, Tsz-Yin 10/10/2014,5:43 PM

## 2014-10-10 NOTE — H&P (Signed)
PULMONARY / CRITICAL CARE MEDICINE   Name: Cody Schmitt MRN: [id number] DOB: 04/09/1935    ADMISSION DATE:  10/10/2014 CONSULTATION DATE:  Darrall Dears  REFERRING MD :  Lovie Macadamia EDP  CHIEF COMPLAINT:  AMS  INITIAL PRESENTATION: 80 year old male presented to [place] with AMS and fever. No identifiable source for infection and was transferred to [facility] for further workup.  STUDIES:  Echo 7124 > normal systolic function, Grade 1 DD. CT head 5/2 > no acute intracranial issues.  SIGNIFICANT EVENTS:   HISTORY OF PRESENT ILLNESS:  80 year old male with PMH as below, which is significant for HTN, Asthma, Hepatitis C, and DM. Wife states he has had trouble walking for the past 3 months. He was noted to have altered mental status 5/1. He was taken to [facility] ED via EMS and was agitated requiring several doses of ativan en route. In ED he was noted to be febrile with elevated WBC meeting SIRS criteria, but no obvious source of infection as CXR and UDS were negative. LP was attempted in ED, however, this was unsuccessful. He remained agitated throughout his ED stay and required frequent doses of ativan. He was transferred to [facility] for ICU admission and further workup.   PAST MEDICAL HISTORY :   has a past medical history of HTN (hypertension); Asthma; Hepatitis C; Dyslipidemia; GERD (gastroesophageal reflux disease); and Diabetes mellitus without complication.  has past surgical history that includes Colonoscopy and cataract surgery. Prior to Admission medications   Medication Sig Start Date End Date Taking? Authorizing Provider  albuterol (PROVENTIL HFA;VENTOLIN HFA) 108 (90 BASE) MCG/ACT inhaler Inhale 2 puffs into the lungs 4 (four) times daily.    Historical Provider, MD  albuterol (PROVENTIL) (2.5 MG/3ML) 0.083% nebulizer solution Take 2.5 mg by nebulization every 6 (six) hours as needed for wheezing.    Historical Provider, MD  amoxicillin (AMOXIL) 500 MG capsule Take 2,000 mg by mouth as needed.  Take 4 tablets 1 hour prior to dental procedure    Historical Provider, MD  aspirin 81 MG tablet Take 81 mg by mouth daily.    Historical Provider, MD  clotrimazole-betamethasone (LOTRISONE) cream Apply topically 2 (two) times daily.    Historical Provider, MD  diclofenac (VOLTAREN) 75 MG EC tablet Take 75 mg by mouth 2 (two) times daily.    Historical Provider, MD  fish oil-omega-3 fatty acids 1000 MG capsule Take 2 g by mouth daily.    Historical Provider, MD  furosemide (LASIX) 40 MG tablet Take 40 mg by mouth daily.    Historical Provider, MD  hydrochlorothiazide (HYDRODIURIL) 25 MG tablet Take 25 mg by mouth daily.    Historical Provider, MD  insulin glargine (LANTUS) 100 UNIT/ML injection Inject 20 Units into the skin at bedtime.    Historical Provider, MD  ipratropium (ATROVENT HFA) 17 MCG/ACT inhaler Inhale 2 puffs into the lungs every 6 (six) hours.    Historical Provider, MD  ipratropium (ATROVENT) 0.02 % nebulizer solution Take 500 mcg by nebulization 4 (four) times daily.    Historical Provider, MD  losartan (COZAAR) 100 MG tablet Take 100 mg by mouth daily.    Historical Provider, MD  metFORMIN (GLUCOPHAGE) 500 MG tablet Take 500-1,000 mg by mouth 2 (two) times daily with a meal. Take 2 tablets in AM and 1 tablet in PM    Historical Provider, MD  metoprolol succinate (TOPROL-XL) 25 MG 24 hr tablet Take 25 mg by mouth daily.    Historical Provider, MD  mometasone (ASMANEX) 220 MCG/INH inhaler Inhale 2 puffs into the lungs daily.    Historical Provider, MD  omeprazole (PRILOSEC) 20 MG capsule Take 20 mg by mouth daily.    Historical Provider, MD  potassium chloride SA (K-DUR,KLOR-CON) 20 MEQ tablet Take 20 mEq by mouth 2 (two) times daily.    Historical Provider, MD  Red Yeast Rice 600 MG CAPS Take 1,200 mg by mouth daily.    Historical Provider, MD  salmeterol (SEREVENT DISKUS) 50 MCG/DOSE diskus inhaler Inhale 2 puffs into the lungs daily.    Historical Provider, MD   Allergies   Allergen Reactions  . Coumadin [Warfarin Sodium]   . Dilaudid [Hydromorphone Hcl]   . Lisinopril   . Statins   . Zetia [Ezetimibe]     FAMILY HISTORY:  has no family status information on file.  SOCIAL HISTORY:  reports that he quit smoking about 32 years ago. His smoking use included Cigarettes. He has a 10 pack-year smoking history. He does not have any smokeless tobacco history on file.  REVIEW OF SYSTEMS:  Unable due to encephalopathy  SUBJECTIVE:   VITAL SIGNS: Temp:  [99 F (37.2 C)] 99 F (37.2 C) (05/02 1552) Pulse Rate:  [121] 121 (05/02 1600) Resp:  [19] 19 (05/02 1600) SpO2:  [98 %] 98 % (05/02 1600) HEMODYNAMICS:   VENTILATOR SETTINGS:   INTAKE / OUTPUT:  Intake/Output Summary (Last 24 hours) at 10/10/14 1605 Last data filed at 10/10/14 1549  Gross per 24 hour  Intake      0 ml  Output    175 ml  Net   -175 ml    PHYSICAL EXAMINATION: General:  Obese male in NAD Neuro:  Agitated, non-communicative, follows with tongue HEENT:  [place]/AT, PERRL, no JVD noted Cardiovascular:  Tachy, appears regualr Lungs:  Clear bilateral breath sounds Abdomen:  Soft, diffusely tender, non-distended Musculoskeletal: 2+ pitting BLE edema.  Skin:  Grossly intact  LABS:  CBC No results for input(s): WBC, HGB, HCT, PLT in the last 168 hours. Coag's No results for input(s): APTT, INR in the last 168 hours. BMET No results for input(s): NA, K, CL, CO2, BUN, CREATININE, GLUCOSE in the last 168 hours. Electrolytes No results for input(s): CALCIUM, MG, PHOS in the last 168 hours. Sepsis Markers No results for input(s): LATICACIDVEN, PROCALCITON, O2SATVEN in the last 168 hours. ABG No results for input(s): PHART, PCO2ART, PO2ART in the last 168 hours. Liver Enzymes No results for input(s): AST, ALT, ALKPHOS, BILITOT, ALBUMIN in the last 168 hours. Cardiac Enzymes No results for input(s): TROPONINI, PROBNP in the last 168 hours. Glucose No results for input(s): GLUCAP in  the last 168 hours.  Imaging No results found.   ASSESSMENT / PLAN:  PULMONARY A: Asthma without acute exacerbation Respiratory alkalosis ?PE P:   Supplemental O2 PRN to keep SpO2 > 92% Continue home Brovana Replace home mometasone with budesonide neb PRN albuterol CXR in AM to evaluate for developing PNA May require intubation for airway protection  CARDIOVASCULAR A:  H/o HTN Chronic HFpEF NSTEMI - elevated troponin at [facility]  P:  Telemetry monitoring Holding ASA, lasix, KCl, HCTZ, losartan, metoprolol, red yeast rice until he can take PO PRN lopressor PRN hydralazine EKG Check Echo Trend troponin  RENAL A:   Kidney injury, suspect acute High AG  P:   Gentle hydration in setting peripheral edema STAT Cmet Follow Bmet  GASTROINTESTINAL A:   Abdominal tenderness  P:   NPO IV Protonix for SUP Check amylase,  lipase, alk phos Check ammonia RUQ ultrasound CT abd if [place] negative  HEMATOLOGIC A:   Mild anemia  P:  Follow CBC Check coags  INFECTIOUS A:   SIRS (WBC, HR), no obvious source of infection Concern CNS infection (failed LP at [facility]) ? intraabdominal source P:   BCx2 5/2 ([facility]) >>> UC 5/2 ([facility]) >>> Abx: zosyn >5/2< Abx: cefepime, start date 5/2 >>> Abx: vancomycin, start date 5/2 >>> Acyclovir 5/2 >>> Will likely need LP if abdominal imaging negative.  ENDOCRINE A:   DM  P:   CBG monitoring and SSI TSH Coritsol Holding home onglyza  NEUROLOGIC A:   Acute encephalopathy, etiology unclear at this time. CT head from [facility] wnl for age  P:   RASS goal: 0/-1 PRN ativan May need precedex infusion Check ASA, UDS, acetaminophen, Serum/urine osmol   FAMILY  - Updates:   - Inter-disciplinary family meet or Palliative Care meeting due by:  5/2   Georgann Housekeeper, AGACNP-BC [place] Pulmonology/Critical Care Pager [phone] or [phone]  10/10/2014 5:14 PM

## 2014-10-11 ENCOUNTER — Inpatient Hospital Stay (HOSPITAL_COMMUNITY): Payer: Medicare Other

## 2014-10-11 DIAGNOSIS — A419 Sepsis, unspecified organism: Principal | ICD-10-CM

## 2014-10-11 DIAGNOSIS — R6521 Severe sepsis with septic shock: Secondary | ICD-10-CM

## 2014-10-11 LAB — GLUCOSE, CAPILLARY
GLUCOSE-CAPILLARY: 135 mg/dL — AB (ref 70–99)
GLUCOSE-CAPILLARY: 133 mg/dL — AB (ref 70–99)
Glucose-Capillary: 130 mg/dL — ABNORMAL HIGH (ref 70–99)
Glucose-Capillary: 135 mg/dL — ABNORMAL HIGH (ref 70–99)
Glucose-Capillary: 153 mg/dL — ABNORMAL HIGH (ref 70–99)
Glucose-Capillary: 165 mg/dL — ABNORMAL HIGH (ref 70–99)

## 2014-10-11 LAB — BASIC METABOLIC PANEL
Anion gap: 9 (ref 5–15)
BUN: 24 mg/dL — AB (ref 6–20)
CALCIUM: 8.4 mg/dL — AB (ref 8.9–10.3)
CO2: 24 mmol/L (ref 22–32)
CREATININE: 1.51 mg/dL — AB (ref 0.61–1.24)
Chloride: 103 mmol/L (ref 101–111)
GFR, EST AFRICAN AMERICAN: 50 mL/min — AB (ref >60)
GFR, EST NON AFRICAN AMERICAN: 43 mL/min — AB (ref >60)
GLUCOSE: 152 mg/dL — AB (ref 70–99)
Potassium: 4.1 mmol/L (ref 3.5–5.1)
Sodium: 136 mmol/L (ref 135–145)

## 2014-10-11 LAB — CBC
HEMATOCRIT: 30.3 % — AB (ref 39.0–52.0)
HEMOGLOBIN: 9.6 g/dL — AB (ref 13.0–17.0)
MCH: 27.2 pg (ref 26.0–34.0)
MCHC: 31.7 g/dL (ref 30.0–36.0)
MCV: 85.8 fL (ref 78.0–100.0)
Platelets: 117 10*3/uL — ABNORMAL LOW (ref 150–400)
RBC: 3.53 MIL/uL — AB (ref 4.22–5.81)
RDW: 16 % — ABNORMAL HIGH (ref 11.5–15.5)
WBC: 13.9 10*3/uL — AB (ref 4.0–10.5)

## 2014-10-11 LAB — TROPONIN I: Troponin I: 0.17 ng/mL — ABNORMAL HIGH (ref <0.031)

## 2014-10-11 LAB — PROCALCITONIN
PROCALCITONIN: 1.92 ng/mL
Procalcitonin: 1.67 ng/mL

## 2014-10-11 LAB — OSMOLALITY: Osmolality: 290 mOsm/kg (ref 275–300)

## 2014-10-11 LAB — AMMONIA: AMMONIA: 15 umol/L (ref 9–35)

## 2014-10-11 MED ORDER — WHITE PETROLATUM GEL
Status: AC
  Administered 2014-10-11: 17:00:00
  Filled 2014-10-11: qty 1

## 2014-10-11 MED ORDER — LACTULOSE ENEMA
300.0000 mL | Freq: Every day | ORAL | Status: DC
  Administered 2014-10-11 – 2014-10-13 (×3): 300 mL via RECTAL
  Filled 2014-10-11 (×3): qty 300

## 2014-10-11 NOTE — Care Management Note (Signed)
Case Management Note  Patient Details  Name: Babette RelicJohn W Fiske MRN: [id number] Date of Birth: 08/24/1934  Subjective/Objective:                  Admitted from home with wife.  Patient very agitated, calling out.  Wife is not present.   Action/Plan:   Expected Discharge Date:  10/18/14               Expected Discharge Plan:     In-House Referral:     Discharge planning Services     Post Acute Care Choice:  Home Health Choice offered to:     DME Arranged:    DME Agency:     HH Arranged:    HH Agency:     Status of Service:  In process, will continue to follow  Medicare Important Message Given:    Date Medicare IM Given:    Medicare IM give by:    Date Additional Medicare IM Given:    Additional Medicare Important Message give by:     If discussed at Long Length of Stay Meetings, dates discussed:    Additional Comments:  Vangie BickerBrown, Elimelech Houseman Jane, RN 10/11/2014, 2:40 PM

## 2014-10-11 NOTE — Progress Notes (Signed)
Order was put in to NTS patient. Rt NTS patient and got moderate amount of tan yellow secretions. RT then suctioned orally and got black clots from the back of patient's throat. Suction was performed without any complication.

## 2014-10-11 NOTE — Progress Notes (Signed)
eLink Physician-Brief Progress Note Patient Name: Cody RelicJohn W Schmitt DOB: 02/03/1935 MRN: [id number]   Date of Service  10/11/2014  HPI/Events of Note  nts  eICU Interventions       Intervention Category Minor Interventions: Routine modifications to care plan (e.g. PRN medications for pain, fever)  Nelda BucksFEINSTEIN,DANIEL J. 10/11/2014, 9:24 PM

## 2014-10-11 NOTE — Progress Notes (Signed)
PULMONARY / CRITICAL CARE MEDICINE   Name: Cody Schmitt MRN: [id number] DOB: 07/21/1934    ADMISSION DATE:  10/10/2014  REFERRING MD :  [facility] EDP  CHIEF COMPLAINT:  AMS  INITIAL PRESENTATION:  80 year [facility]MS and fever. No identifiable source for infection and [facility]  STUDIES:  CT head 5/2 > no acute intracranial issues RUQ u/s 5/03 > cholelithiasis, diffuse coarsening of hepatic parenchymal structure  SIGNIFICANT EVENTS: 5/2 Transfer from [facility]  SUBJECTIVE:  RN reports pt follows commands on WUA, but gets agitated.  Remains on precedex and in restraints.  VITAL SIGNS: Temp:  [98.3 F (36.8 C)-99.1 F (37.3 C)] 98.3 F (36.8 C) (05/03 0757) Pulse Rate:  [79-129] 91 (05/03 0930) Resp:  [14-35] 20 (05/03 0930) BP: (75-139)/(29-118) 128/51 mmHg (05/03 0930) SpO2:  [83 %-100 %] 94 % (05/03 0930) Weight:  [276 lb 7.3 oz (125.4 kg)-282 lb 3 oz (128 kg)] 276 lb 7.3 oz (125.4 kg) (05/03 0500) HEMODYNAMICS: CVP:  [8 mmHg-9 mmHg] 9 mmHg INTAKE / OUTPUT:  Intake/Output Summary (Last 24 hours) at 10/11/14 1003 Last data filed at 10/11/14 0900  Gross per 24 hour  Intake 2115.89 ml  Output    945 ml  Net 1170.89 ml    PHYSICAL EXAMINATION: General: no distress Neuro:  RASS -2 HEENT: no sinus tenderness Cardiovascular: regular Lungs: no wheeze Abdomen:  Soft, non tender Musculoskeletal: 1+ edema Skin: no rashes  LABS:  CBC  Recent Labs Lab 10/10/14 1930 10/11/14 0415  WBC 11.3* 13.9*  HGB 9.1* 9.6*  HCT 27.7* 30.3*  PLT 84* 117*   Coag's  Recent Labs Lab 10/10/14 1930  INR 1.44   BMET  Recent Labs Lab 10/10/14 1930 10/11/14 0415  NA 136 136  K 3.8 4.1  CL 106 103  CO2 25 24  BUN 24* 24*  CREATININE 1.53* 1.51*  GLUCOSE 143* 152*   Electrolytes  Recent Labs Lab 10/10/14 1930 10/11/14 0415  CALCIUM 8.1* 8.4*  MG 1.6*  --   PHOS 3.3  --    Sepsis  Markers  Recent Labs Lab 10/10/14 1930 10/11/14 0415  LATICACIDVEN 1.5  --   PROCALCITON 1.92  1.97 1.67   Liver Enzymes  Recent Labs Lab 10/10/14 1930  AST 62*  ALT 40  ALKPHOS 114  BILITOT 1.5*  ALBUMIN 2.3*   Cardiac Enzymes  Recent Labs Lab 10/10/14 1930 10/10/14 2215 10/11/14 0415  TROPONINI 0.13* 0.13* 0.17*   Glucose  Recent Labs Lab 10/10/14 1553 10/10/14 2010 10/10/14 2359 10/11/14 0416 10/11/14 0754  GLUCAP 177* 145* 165* 135* 153*    Imaging Dg Chest Port 1 View  10/10/2014   CLINICAL DATA:  Central line placement  EXAM: PORTABLE CHEST - 1 VIEW  COMPARISON:  10/09/2014  FINDINGS: There is a new right subclavian central line with tip in the SVC several cm below the azygos vein junction. There is no pneumothorax. The lungs are clear except for mild interstitial fluid or thickening.  IMPRESSION: Satisfactorily positioned right subclavian central line. No pneumothorax.   Electronically Signed   By: Ellery Plunkaniel R Mitchell M.D.   On: 10/10/2014 23:34     ASSESSMENT / PLAN:  PULMONARY A: Hx of asthma. P:   Oxygen to keep SpO2 > 92% Continue brovana, pulmicort and prn abuterol  CARDIOVASCULAR Rt [place] CVL 5/02 >>  A:  Septic shock. Hx of HTN. NSTEMI. P:  F/u Echo Wean pressors to  keep MAP > 65 Holding ASA, lasix, KCl, HCTZ, losartan, metoprolol, red yeast rice  RENAL A:   CKD stage 3. Anion gap metabolic acidosis >> no osmolar gap >> resolved. P:   Continue IV fluids Monitor renal fx urine outpt  GASTROINTESTINAL A:   Abdominal tenderness >> improved 5/03. Hx of Hep C. P:   NPO IV Protonix for SUP  HEMATOLOGIC A:   Anemia, thrombocytopenia of critical illness. P:  F/u CBC SQ heparin for DVT prevention  INFECTIOUS A:   ?sepsis >> no obvious source; doubt meningitis/encephalitis. P:   Day 2 of Abx, currently on vancomycin, cefepime D/c acyclovir 5/03 Defer LP for now  ENDOCRINE A:   DM. P:   SSI Holding home  onglyza  NEUROLOGIC A:   Acute encephalopathy cause uncertain >> doubt meningitis/encephalitis. Elevated ammonia. P:   Wean off precedex for RASS goal 0 Add lactulose 5/03  CC time 35 minutes.  Coralyn Helling, MD [facility] Pulmonary/Critical Care 10/11/2014, 10:25 AM Pager:  [phone] After 3pm call: [phone]

## 2014-10-12 ENCOUNTER — Encounter (HOSPITAL_COMMUNITY): Payer: Self-pay | Admitting: Neurology

## 2014-10-12 ENCOUNTER — Inpatient Hospital Stay (HOSPITAL_COMMUNITY): Payer: Medicare Other

## 2014-10-12 DIAGNOSIS — R0600 Dyspnea, unspecified: Secondary | ICD-10-CM

## 2014-10-12 LAB — CBC
HCT: 32 % — ABNORMAL LOW (ref 39.0–52.0)
HEMOGLOBIN: 10.1 g/dL — AB (ref 13.0–17.0)
MCH: 27.3 pg (ref 26.0–34.0)
MCHC: 31.6 g/dL (ref 30.0–36.0)
MCV: 86.5 fL (ref 78.0–100.0)
Platelets: 105 10*3/uL — ABNORMAL LOW (ref 150–400)
RBC: 3.7 MIL/uL — ABNORMAL LOW (ref 4.22–5.81)
RDW: 16.1 % — ABNORMAL HIGH (ref 11.5–15.5)
WBC: 13.2 10*3/uL — AB (ref 4.0–10.5)

## 2014-10-12 LAB — COMPREHENSIVE METABOLIC PANEL
ALBUMIN: 2.1 g/dL — AB (ref 3.5–5.0)
ALT: 36 U/L (ref 17–63)
ANION GAP: 8 (ref 5–15)
AST: 44 U/L — ABNORMAL HIGH (ref 15–41)
Alkaline Phosphatase: 93 U/L (ref 38–126)
BUN: 27 mg/dL — AB (ref 6–20)
CO2: 22 mmol/L (ref 22–32)
CREATININE: 1.4 mg/dL — AB (ref 0.61–1.24)
Calcium: 8.3 mg/dL — ABNORMAL LOW (ref 8.9–10.3)
Chloride: 111 mmol/L (ref 101–111)
GFR calc Af Amer: 54 mL/min — ABNORMAL LOW (ref >60)
GFR calc non Af Amer: 47 mL/min — ABNORMAL LOW (ref >60)
Glucose, Bld: 174 mg/dL — ABNORMAL HIGH (ref 70–99)
Potassium: 4.1 mmol/L (ref 3.5–5.1)
Sodium: 141 mmol/L (ref 135–145)
TOTAL PROTEIN: 5.5 g/dL — AB (ref 6.5–8.1)
Total Bilirubin: 1.6 mg/dL — ABNORMAL HIGH (ref 0.3–1.2)

## 2014-10-12 LAB — BLOOD GAS, ARTERIAL
ACID-BASE DEFICIT: 3.3 mmol/L — AB (ref 0.0–2.0)
Bicarbonate: 21.3 mEq/L (ref 20.0–24.0)
Drawn by: 398661
O2 Content: 6 L/min
O2 Saturation: 94.2 %
PCO2 ART: 39.3 mmHg (ref 35.0–45.0)
Patient temperature: 98.6
TCO2: 22.5 mmol/L (ref 0–100)
pH, Arterial: 7.354 (ref 7.350–7.450)
pO2, Arterial: 74.4 mmHg — ABNORMAL LOW (ref 80.0–100.0)

## 2014-10-12 LAB — GLUCOSE, CAPILLARY
GLUCOSE-CAPILLARY: 183 mg/dL — AB (ref 70–99)
Glucose-Capillary: 145 mg/dL — ABNORMAL HIGH (ref 70–99)
Glucose-Capillary: 177 mg/dL — ABNORMAL HIGH (ref 70–99)
Glucose-Capillary: 184 mg/dL — ABNORMAL HIGH (ref 70–99)
Glucose-Capillary: 164 mg/dL — ABNORMAL HIGH (ref 70–99)
Glucose-Capillary: 148 mg/dL — ABNORMAL HIGH (ref 70–99)

## 2014-10-12 LAB — PROCALCITONIN: Procalcitonin: 1.64 ng/mL

## 2014-10-12 MED ORDER — DEXMEDETOMIDINE HCL IN NACL 200 MCG/50ML IV SOLN
0.2000 ug/kg/h | INTRAVENOUS | Status: DC
  Administered 2014-10-12: 0.2 ug/kg/h via INTRAVENOUS
  Administered 2014-10-13 (×5): 0.7 ug/kg/h via INTRAVENOUS
  Filled 2014-10-12 (×6): qty 50

## 2014-10-12 MED ORDER — PERFLUTREN LIPID MICROSPHERE
1.0000 mL | INTRAVENOUS | Status: AC | PRN
  Administered 2014-10-12: 4 mL via INTRAVENOUS
  Filled 2014-10-12: qty 10

## 2014-10-12 NOTE — Progress Notes (Signed)
eLink Physician-Brief Progress Note Patient Name: Cody RelicJohn W Willmott DOB: 02/13/1935 MRN: [id number]   Date of Service  10/12/2014  HPI/Events of Note  Agitation worsening off precedex  eICU Interventions  Restrain as harm to self and others Re add precedex, see if able to respond     Intervention Category Major Interventions: Change in mental status - evaluation and management  Nelda BucksFEINSTEIN,DANIEL J. 10/12/2014, 9:45 PM

## 2014-10-12 NOTE — Progress Notes (Signed)
NT suction left nare x2, oral x1

## 2014-10-12 NOTE — Progress Notes (Addendum)
PULMONARY / CRITICAL CARE MEDICINE   Name: Cody RelicJohn W Schmitt MRN: [id number] DOB: 03/29/1935    ADMISSION DATE:  10/10/2014  REFERRING MD :  Maryruth Bun[facility] EDP  CHIEF COMPLAINT:  AMS  INITIAL PRESENTATION:  80 [facility] and fever. No identifiable source for infection and [facility]  STUDIES:  CT head 5/2 > no acute intracranial issues RUQ u/s 5/03 > cholelithiasis, diffuse coarsening of hepatic parenchymal structure  SIGNIFICANT EVENTS: [facility] Neuro consulted  SUBJECTIVE:  Intermittently agitated.  Still on levophed, precedex.  VITAL SIGNS: Temp:  [98.7 F (37.1 C)-99.6 F (37.6 C)] 98.7 F (37.1 C) (05/04 0749) Pulse Rate:  [67-102] 73 (05/04 0800) Resp:  [9-31] 10 (05/04 0800) BP: (61-164)/(37-136) 111/58 mmHg (05/04 0800) SpO2:  [92 %-99 %] 96 % (05/04 0800) Weight:  [278 lb 3.5 oz (126.2 kg)] 278 lb 3.5 oz (126.2 kg) (05/04 0500) HEMODYNAMICS: CVP:  [7 mmHg] 7 mmHg INTAKE / OUTPUT:  Intake/Output Summary (Last 24 hours) at 10/12/14 0858 Last data filed at 10/12/14 0800  Gross per 24 hour  Intake 2885.81 ml  Output   1260 ml  Net 1625.81 ml    PHYSICAL EXAMINATION: General: no distress Neuro:  RASS -2 >> screams he needs help when sedation decreased HEENT: no sinus tenderness Cardiovascular: regular Lungs: sonorous respirations, b/l rhonchi Abdomen:  Soft, non tender Musculoskeletal: 1+ edema Skin: no rashes  LABS:  CBC  Recent Labs Lab 10/10/14 1930 10/11/14 0415 10/12/14 0515  WBC 11.3* 13.9* 13.2*  HGB 9.1* 9.6* 10.1*  HCT 27.7* 30.3* 32.0*  PLT 84* 117* 105*   Coag's  Recent Labs Lab 10/10/14 1930  INR 1.44   BMET  Recent Labs Lab 10/10/14 1930 10/11/14 0415 10/12/14 0515  NA 136 136 141  K 3.8 4.1 4.1  CL 106 103 111  CO2 25 24 22   BUN 24* 24* 27*  CREATININE 1.53* 1.51* 1.40*  GLUCOSE 143* 152* 174*   Electrolytes  Recent  Labs Lab 10/10/14 1930 10/11/14 0415 10/12/14 0515  CALCIUM 8.1* 8.4* 8.3*  MG 1.6*  --   --   PHOS 3.3  --   --    Sepsis Markers  Recent Labs Lab 10/10/14 1930 10/11/14 0415 10/12/14 0515  LATICACIDVEN 1.5  --   --   PROCALCITON 1.92  1.97 1.67 1.64   Liver Enzymes  Recent Labs Lab 10/10/14 1930 10/12/14 0515  AST 62* 44*  ALT 40 36  ALKPHOS 114 93  BILITOT 1.5* 1.6*  ALBUMIN 2.3* 2.1*   Cardiac Enzymes  Recent Labs Lab 10/10/14 1930 10/10/14 2215 10/11/14 0415  TROPONINI 0.13* 0.13* 0.17*   Glucose  Recent Labs Lab 10/11/14 1117 10/11/14 1621 10/11/14 1933 10/11/14 2350 10/12/14 0350 10/12/14 0747  GLUCAP 133* 135* 130* 145* 177* 184*    Imaging [place] Abdomen Limited Ruq  10/11/2014   CLINICAL DATA:  ` right upper quadrant pain  EXAM: US ABDOMEN LIMITED - RIGHT UPPER QUADRANT  COMPARISON:  None.  FINDINGS: Gallbladder:  Multiple calculi are present in the gallbladder lumen. There is no gallbladder wall thickening. The patient was not tender over the gallbladder.  Common bile duct:  Diameter: 3.3 mm, normal  Liver:  There is diffusely coarsened echotexture of the liver without focal lesion. There is no intrahepatic bile duct dilatation.  IMPRESSION: Cholelithiasis. Diffusely coarsened hepatic parenchymal echotexture, nonspecific.   Electronically Signed   By: Ellery Plunkaniel R Mitchell  M.D.   On: 10/11/2014 01:23    Lab Results  Component Value Date   TSH 1.156 10/10/2014    ASSESSMENT / PLAN:  PULMONARY A: Hx of asthma. As risk for aspiration. P:   Oxygen to keep SpO2 > 92% Continue brovana, pulmicort and prn albuterol Monitor need for intubation >> might need this if further neuro testing needed  CARDIOVASCULAR Rt [place] CVL 5/02 >>  A:  Septic shock. Hx of HTN. NSTEMI. P:  F/u Echo Wean pressors to keep MAP > 65 Holding ASA, lasix, KCl, HCTZ, losartan, metoprolol, red yeast rice  RENAL A:   CKD stage 3. Anion gap metabolic acidosis >> no  osmolar gap >> resolved. P:   Continue IV fluids Monitor renal fx urine outpt  GASTROINTESTINAL A:   Abdominal tenderness >> improved 5/03. Hx of Hep C. P:   NPO IV Protonix for SUP  HEMATOLOGIC A:   Anemia, thrombocytopenia of critical illness. P:  F/u CBC SQ heparin for DVT prevention  INFECTIOUS A:   ?sepsis >> no obvious source; doubt meningitis/encephalitis. P:   Day 3 of Abx, currently on vancomycin, cefepime  ENDOCRINE A:   DM. P:   SSI Holding home onglyza  NEUROLOGIC A:   Acute encephalopathy cause uncertain >> doubt meningitis/encephalitis. Elevated ammonia. P:   Wean off precedex for RASS goal 0 Added lactulose 5/03 Neuro consulted 5/04 >> defer to neuro if LP is needed (likely would need to be done with IR and might need intubation)  Updated pt's wife at bedside.  CC time 35 minutes.  Coralyn HellingVineet Alic Hilburn, MD [facility] 10/12/2014, 8:58 AM Pager:  [phone] After 3pm call: [phone]

## 2014-10-12 NOTE — Consult Note (Signed)
NEURO HOSPITALIST CONSULT NOTE    Reason for Consult: AMS  HPI:                                                                                                                                          Majority of history obtained from chart.   Cody Schmitt is an 80 y.o. male with PMH as below, "which is significant for HTN, Asthma, Hepatitis C, and DM. Wife states he has had trouble walking for the past 3 months. He was noted to have altered mental status 5/1. [facility] via [facility] and was agitated requiring several doses of ativan en route. In ED he was noted to be febrile with elevated WBC meeting SIRS criteria, but no obvious source of infection as CXR and UDS were negative. LP was attempted in ED, however, this was unsuccessful. He remained agitated throughout his ED stay and required frequent doses of ativan. HE was given Acyclovir, Vancomycin and Pipercillin/Tozabactam prior to leaving [place]. [facility] and [facility] workup.   In [place] his WBC was 15.5, ALT 215, AST 60   On arrival Ammonia was 50, TSH 1.156, Cortisol 20.8, BNP 287.5, magnesium 1.6, BUN 24 and Cr 1.53, AST mildly elevated at 62,  ALT 40  Current WBC 13.2 and Afebrile.   [facility] and [facility]--Urine cx and blood cultures from 5/3 no growth to date but neither finalized  Currently on Vancomycin and Cefepime. He was on Acyclovir received a total of three doses (one while in [facility] and 2 further doses [facility]. )  Past Medical History  Diagnosis Date  . HTN (hypertension)   . Asthma   . Hepatitis C   . Dyslipidemia   . GERD (gastroesophageal reflux disease)   . Diabetes mellitus without complication     Past Surgical History  Procedure Laterality Date  . Colonoscopy    . Cataract surgery      Family History  Problem Relation Age of Onset  . Hypertension Mother   . Hypertension Father     Social History:  reports that he  quit smoking about 32 years ago. His smoking use included Cigarettes. He has a 10 pack-year smoking history. He does not have any smokeless tobacco history on file. His alcohol and drug histories are not on file.  Allergies  Allergen Reactions  . Lisinopril Cough  . Statins Nausea And Vomiting  . Zetia [Ezetimibe] Nausea And Vomiting  . Coumadin [Warfarin Sodium] Rash  . Dilaudid [Hydromorphone Hcl] Itching and Rash    MEDICATIONS:  Prior to Admission:  Prescriptions prior to admission  Medication Sig Dispense Refill Last Dose  . acetaminophen (TYLENOL) 650 MG CR tablet Take 1,300 mg by mouth 2 (two) times daily.   10/08/2014  . albuterol (PROVENTIL HFA;VENTOLIN HFA) 108 (90 BASE) MCG/ACT inhaler Inhale 2 puffs into the lungs 2 (two) times daily.    10/08/2014  . amoxicillin (AMOXIL) 500 MG capsule Take 2,000 mg by mouth See admin instructions. Take 4 tablets (2000 mg) by mouth 1 hour prior to dental procedure   Dec 2015  . arformoterol (BROVANA) 15 MCG/2ML NEBU Take 15 mcg by nebulization 2 (two) times daily.   10/09/2014 at am  . aspirin EC 81 MG tablet Take 81 mg by mouth daily.   10/08/2014  . Cholecalciferol (VITAMIN D) 2000 UNITS tablet Take 2,000 Units by mouth daily.   10/08/2014  . clotrimazole-betamethasone (LOTRISONE) cream Apply 1 application topically 2 (two) times daily as needed (rash).    unknown  . furosemide (LASIX) 40 MG tablet Take 40-80 mg by mouth 2 (two) times daily. Take 2 tablets (80 mg) daily at 6am and 1 tablet (40 mg) daily at 4pm   10/08/2014  . hydrochlorothiazide (HYDRODIURIL) 25 MG tablet Take 25 mg by mouth daily.   10/08/2014  . insulin glargine (LANTUS) 100 UNIT/ML injection Inject 25 Units into the skin at bedtime.    10/08/2014  . losartan (COZAAR) 100 MG tablet Take 100 mg by mouth daily.   10/08/2014  . metoprolol succinate (TOPROL-XL) 25 MG 24 hr  tablet Take 25 mg by mouth daily.   10/08/2014 at sometime in the morning  . mometasone (ASMANEX) 220 MCG/INH inhaler Inhale 2 puffs into the lungs daily.   10/08/2014  . Omega-3 Fatty Acids (FISH OIL) 1200 MG CAPS Take 2,400 mg by mouth daily.   10/08/2014  . omeprazole (PRILOSEC) 20 MG capsule Take 20 mg by mouth daily.   10/08/2014  . Polyvinyl Alcohol-Povidone (REFRESH OP) Place 1 drop into both eyes daily as needed (dry eyes).   unknown  . potassium chloride SA (K-DUR,KLOR-CON) 20 MEQ tablet Take 20-40 mEq by mouth 2 (two) times daily. Take 2 tablets (40 meq) daily at 6am and 1 tablet (20 meq) daily at 4pm   10/08/2014  . Red Yeast Rice 600 MG CAPS Take 1,200 mg by mouth daily.   10/08/2014  . saxagliptin HCl (ONGLYZA) 5 MG TABS tablet Take 5 mg by mouth daily.   10/08/2014   Scheduled: . antiseptic oral rinse  7 mL Mouth Rinse q12n4p  . arformoterol  15 mcg Nebulization BID  . budesonide  0.25 mg Nebulization BID  . ceFEPime (MAXIPIME) IV  2 g Intravenous Q24H  . chlorhexidine  15 mL Mouth Rinse BID  . heparin  5,000 Units Subcutaneous 3 times per day  . insulin aspart  2-6 Units Subcutaneous 6 times per day  . lactulose  300 mL Rectal Daily  . pantoprazole (PROTONIX) IV  40 mg Intravenous QHS  . vancomycin  1,500 mg Intravenous Q24H   Continuous: . sodium chloride 75 mL/hr at 10/12/14 0840  . dexmedetomidine Stopped (10/12/14 1201)  . norepinephrine (LEVOPHED) Adult infusion 2 mcg/min (10/12/14 0617)   ZOX:[id number]:hydrALAZINE, LORazepam, sodium chloride   ROS:  History obtained from unobtainable from patient due to mental status  Blood pressure 110/55, pulse 68, temperature 98.9 F (37.2 C), temperature source Oral, resp. rate 20, height 5' 4.96" (1.65 m), weight 126.2 kg (278 lb 3.5 oz), SpO2 98 %.   Neurologic Examination:                                                                                                       HEENT-  Normocephalic, no lesions, without obvious abnormality.  Normal external eye and conjunctiva.  Normal TM's bilaterally.  Normal auditory canals and external ears. Normal external nose, mucus membranes and septum.  Normal pharynx. Cardiovascular- S1, S2 normal, pulses palpable throughout   Lungs- no tachypnea, retractions or cyanosis, no chest wall deformities or tenderness Abdomen- normal findings: bowel sounds normal Extremities- no edema Lymph-no adenopathy palpable Musculoskeletal-no joint tenderness, deformity or swelling Skin-warm and dry, no hyperpigmentation, vitiligo, or suspicious lesions  Neurological Examination Mental Status: Alert, not oriented oriented not able to follow commands and repeats "help me" in a mumbling voice.  He is 4 point restraints for agitation. He follows no commands. Cranial Nerves: II: Discs flat bilaterally; no blink to threat, pupils equal 2mm, round, reactive to light and accommodation III,IV, VI: ptosis not present, oculocephalic reflex intact.  V,VII: face symmetric with grimace,corneal reflex intact VIII: responds to voice IX,X: unable to visualize XI: bilateral shoulder shrug XII: midline tongue extension Motor: Moving all extremities antigravity and spontaniously Sensory: no response to noxious stimuli in all 4 extremities.  Deep Tendon Reflexes: 1+ and symmetric throughout UE no KJ or AJ Plantars: Right: downgoing   Left: down going Cerebellar: Unable to assess Gait: unable to assess      Lab Results: Basic Metabolic Panel:  Recent Labs Lab 10/10/14 1930 10/11/14 0415 10/12/14 0515  NA 136 136 141  K 3.8 4.1 4.1  CL 106 103 111  CO2 25 24 22   GLUCOSE 143* 152* 174*  BUN 24* 24* 27*  CREATININE 1.53* 1.51* 1.40*  CALCIUM 8.1* 8.4* 8.3*  MG 1.6*  --   --   PHOS 3.3  --   --     Liver Function Tests:  Recent Labs Lab 10/10/14 1930 10/12/14 0515   AST 62* 44*  ALT 40 36  ALKPHOS 114 93  BILITOT 1.5* 1.6*  PROT 5.7* 5.5*  ALBUMIN 2.3* 2.1*    Recent Labs Lab 10/10/14 1930  LIPASE 21*  AMYLASE 28    Recent Labs Lab 10/10/14 1930 10/11/14 1152  AMMONIA 50* 15    CBC:  Recent Labs Lab 10/10/14 1930 10/11/14 0415 10/12/14 0515  WBC 11.3* 13.9* 13.2*  NEUTROABS 8.8*  --   --   HGB 9.1* 9.6* 10.1*  HCT 27.7* 30.3* 32.0*  MCV 85.0 85.8 86.5  PLT 84* 117* 105*    Cardiac Enzymes:  Recent Labs Lab 10/10/14 1930 10/10/14 2215 10/11/14 0415  TROPONINI 0.13* 0.13* 0.17*    Lipid Panel: No results for input(s): CHOL, TRIG, HDL, CHOLHDL, VLDL, LDLCALC in the last 168 hours.  CBG:  Recent Labs  Lab 10/11/14 1933 10/11/14 2350 10/12/14 0350 10/12/14 0747 10/12/14 1158  GLUCAP 130* 145* 177* 184* 183*    Microbiology: Results for orders placed or performed during the hospital encounter of 10/10/14  MRSA PCR Screening     Status: None   Collection Time: 10/10/14  3:40 PM  Result Value Ref Range Status   MRSA by PCR NEGATIVE NEGATIVE Final    Comment:        The GeneXpert MRSA Assay (FDA approved for NASAL specimens only), is one component of a comprehensive MRSA colonization surveillance program. It is not intended to diagnose MRSA infection nor to guide or monitor treatment for MRSA infections.     Coagulation Studies:  Recent Labs  10/10/14 1930  LABPROT 17.7*  INR 1.44    Imaging: Dg Chest Port 1 View  10/10/2014   CLINICAL DATA:  Central line placement  EXAM: PORTABLE CHEST - 1 VIEW  COMPARISON:  10/09/2014  FINDINGS: There is a new right subclavian central line with tip in the SVC several cm below the azygos vein junction. There is no pneumothorax. The lungs are clear except for mild interstitial fluid or thickening.  IMPRESSION: Satisfactorily positioned right subclavian central line. No pneumothorax.   Electronically Signed   By: Ellery Plunk M.D.   On: 10/10/2014 23:34    US Abdomen Limited Ruq  10/11/2014   CLINICAL DATA:  ` right upper quadrant pain  EXAM: US ABDOMEN LIMITED - RIGHT UPPER QUADRANT  COMPARISON:  None.  FINDINGS: Gallbladder:  Multiple calculi are present in the gallbladder lumen. There is no gallbladder wall thickening. The patient was not tender over the gallbladder.  Common bile duct:  Diameter: 3.3 mm, normal  Liver:  There is diffusely coarsened echotexture of the liver without focal lesion. There is no intrahepatic bile duct dilatation.  IMPRESSION: Cholelithiasis. Diffusely coarsened hepatic parenchymal echotexture, nonspecific.   Electronically Signed   By: Ellery Plunk M.D.   On: 10/11/2014 01:23   Felicie Morn PA-C [facility] [phone]  10/12/2014, 12:29 PM   Patient seen and examined.  Clinical course and management discussed.  Necessary edits performed.  I agree with the above.  Assessment and plan of care developed and discussed below.     Assessment/Plan: 80 year old male with altered mental status, fever and elevated white blood cell count.  Infection suspected but source has not been identified.  Patient has been on broad spectrum antibiotics as well as Acyclovir.  Currently Acyclovir has been discontinued.  Patient has been afebrile but white blood cell count continues to increase.  On review of lab work LFT's have been elevated as well as ammonia level.  These are now trending towards normal.  Hepatic ultrasound shows cholelithiasis and a diffusely coarse hepatic parenchyma.  Head CT was unremarkable.  Although meningitis, either viral or bacterial, is on the differential, LP may not be helpful at this time since patient has been on broad spectrum antibiotics and Acyclovir.  Cultures are negative to date.  Patient on Cefepime and although this did not cause his initial change in mental status, can not rule out the possibility that this may be contributing to its prolongation.  Differential includes encephalopathy  related to abdominal abnormalities/infection.  This appears to be improving and mental status may be lagging behind the numbers.  Also to be considered is an unknown infection not adequately addressed at this time versus medication effects.  Recommendations: 1.  EEG 2.  LP.  This will need to  be performed under fluoro.  Last PT prolonged.  Would send fluid for HSV testing. 3.  If patient to be sedated for LP would consider MRI of the brain at that time as well.   4.  Consider antibiotic substitution   Thana Farr, MD [facility] [phone]  10/12/2014  5:41 PM

## 2014-10-13 ENCOUNTER — Inpatient Hospital Stay (HOSPITAL_COMMUNITY): Payer: Medicare Other

## 2014-10-13 DIAGNOSIS — J690 Pneumonitis due to inhalation of food and vomit: Secondary | ICD-10-CM

## 2014-10-13 LAB — GLUCOSE, CAPILLARY
GLUCOSE-CAPILLARY: 127 mg/dL — AB (ref 70–99)
GLUCOSE-CAPILLARY: 141 mg/dL — AB (ref 70–99)
GLUCOSE-CAPILLARY: 176 mg/dL — AB (ref 70–99)
Glucose-Capillary: 161 mg/dL — ABNORMAL HIGH (ref 70–99)
Glucose-Capillary: 162 mg/dL — ABNORMAL HIGH (ref 70–99)
Glucose-Capillary: 173 mg/dL — ABNORMAL HIGH (ref 70–99)

## 2014-10-13 LAB — URINE MICROSCOPIC-ADD ON

## 2014-10-13 LAB — BASIC METABOLIC PANEL
ANION GAP: 9 (ref 5–15)
BUN: 28 mg/dL — ABNORMAL HIGH (ref 6–20)
CHLORIDE: 117 mmol/L — AB (ref 101–111)
CO2: 19 mmol/L — AB (ref 22–32)
Calcium: 8.3 mg/dL — ABNORMAL LOW (ref 8.9–10.3)
Creatinine, Ser: 1.23 mg/dL (ref 0.61–1.24)
GFR calc non Af Amer: 55 mL/min — ABNORMAL LOW (ref >60)
GLUCOSE: 148 mg/dL — AB (ref 70–99)
POTASSIUM: 3.7 mmol/L (ref 3.5–5.1)
SODIUM: 145 mmol/L (ref 135–145)

## 2014-10-13 LAB — URINALYSIS, ROUTINE W REFLEX MICROSCOPIC
Bilirubin Urine: NEGATIVE
GLUCOSE, UA: NEGATIVE mg/dL
Ketones, ur: NEGATIVE mg/dL
LEUKOCYTES UA: NEGATIVE
Nitrite: NEGATIVE
PH: 6 (ref 5.0–8.0)
Protein, ur: 30 mg/dL — AB
SPECIFIC GRAVITY, URINE: 1.02 (ref 1.005–1.030)
Urobilinogen, UA: 0.2 mg/dL (ref 0.0–1.0)

## 2014-10-13 LAB — CBC
HCT: 31 % — ABNORMAL LOW (ref 39.0–52.0)
HEMOGLOBIN: 9.9 g/dL — AB (ref 13.0–17.0)
MCH: 27.4 pg (ref 26.0–34.0)
MCHC: 31.9 g/dL (ref 30.0–36.0)
MCV: 85.9 fL (ref 78.0–100.0)
PLATELETS: 118 10*3/uL — AB (ref 150–400)
RBC: 3.61 MIL/uL — ABNORMAL LOW (ref 4.22–5.81)
RDW: 16 % — AB (ref 11.5–15.5)
WBC: 9.6 10*3/uL (ref 4.0–10.5)

## 2014-10-13 LAB — SODIUM, URINE, RANDOM: Sodium, Ur: <10 mmol/L

## 2014-10-13 LAB — OSMOLALITY, URINE: Osmolality, Ur: 715 mOsm/kg (ref 390–1090)

## 2014-10-13 LAB — POCT I-STAT 3, ART BLOOD GAS (G3+)
Acid-base deficit: 5 mmol/L — ABNORMAL HIGH (ref 0.0–2.0)
BICARBONATE: 20 meq/L (ref 20.0–24.0)
O2 Saturation: 100 %
PH ART: 7.35 (ref 7.350–7.450)
TCO2: 21 mmol/L (ref 0–100)
pCO2 arterial: 36.3 mmHg (ref 35.0–45.0)
pO2, Arterial: 307 mmHg — ABNORMAL HIGH (ref 80.0–100.0)

## 2014-10-13 LAB — MAGNESIUM: MAGNESIUM: 2 mg/dL (ref 1.7–2.4)

## 2014-10-13 LAB — PHOSPHORUS: PHOSPHORUS: 3.5 mg/dL (ref 2.5–4.6)

## 2014-10-13 MED ORDER — MIDAZOLAM HCL 2 MG/2ML IJ SOLN: 1.0000 mg | INTRAMUSCULAR | Status: DC | PRN

## 2014-10-13 MED ORDER — VITAL HIGH PROTEIN PO LIQD
1000.0000 mL | ORAL | Status: DC
  Administered 2014-10-13: 1000 mL
  Administered 2014-10-14: 08:00:00
  Administered 2014-10-14: 1000 mL
  Administered 2014-10-15: 07:00:00
  Administered 2014-10-15: 1000 mL
  Filled 2014-10-13 (×5): qty 1000

## 2014-10-13 MED ORDER — SODIUM CHLORIDE 0.9 % IV BOLUS (SEPSIS)
500.0000 mL | Freq: Once | INTRAVENOUS | Status: AC
  Administered 2014-10-13: 500 mL via INTRAVENOUS

## 2014-10-13 MED ORDER — GADOBENATE DIMEGLUMINE 529 MG/ML IV SOLN
20.0000 mL | Freq: Once | INTRAVENOUS | Status: AC | PRN
  Administered 2014-10-13: 20 mL via INTRAVENOUS

## 2014-10-13 MED ORDER — FENTANYL CITRATE (PF) 100 MCG/2ML IJ SOLN
INTRAMUSCULAR | Status: AC
  Filled 2014-10-13: qty 2

## 2014-10-13 MED ORDER — PANTOPRAZOLE SODIUM 40 MG PO PACK
40.0000 mg | PACK | ORAL | Status: DC
  Administered 2014-10-13 – 2014-10-15 (×3): 40 mg
  Filled 2014-10-13 (×4): qty 20

## 2014-10-13 MED ORDER — FENTANYL CITRATE (PF) 100 MCG/2ML IJ SOLN
50.0000 ug | Freq: Once | INTRAMUSCULAR | Status: AC
  Administered 2014-10-13: 50 ug via INTRAVENOUS

## 2014-10-13 MED ORDER — PRO-STAT SUGAR FREE PO LIQD
60.0000 mL | Freq: Three times a day (TID) | ORAL | Status: DC
  Administered 2014-10-13 – 2014-10-16 (×8): 60 mL
  Filled 2014-10-13 (×10): qty 60

## 2014-10-13 MED ORDER — DEXMEDETOMIDINE HCL IN NACL 200 MCG/50ML IV SOLN
0.0000 ug/kg/h | INTRAVENOUS | Status: DC
  Administered 2014-10-13: 0.6 ug/kg/h via INTRAVENOUS
  Administered 2014-10-13: 0.7 ug/kg/h via INTRAVENOUS
  Administered 2014-10-13: 0.5 ug/kg/h via INTRAVENOUS
  Administered 2014-10-13: 0.8 ug/kg/h via INTRAVENOUS
  Administered 2014-10-13: 0.6 ug/kg/h via INTRAVENOUS
  Filled 2014-10-13: qty 100
  Filled 2014-10-13 (×2): qty 50

## 2014-10-13 MED ORDER — FENTANYL CITRATE (PF) 100 MCG/2ML IJ SOLN: 50.0000 ug | INTRAMUSCULAR | Status: DC | PRN

## 2014-10-13 MED ORDER — LACTULOSE 10 GM/15ML PO SOLN
30.0000 g | Freq: Every day | ORAL | Status: DC
  Administered 2014-10-14 – 2014-10-16 (×3): 30 g
  Filled 2014-10-13 (×3): qty 45

## 2014-10-13 MED ORDER — CHLORHEXIDINE GLUCONATE 0.12 % MT SOLN
15.0000 mL | Freq: Two times a day (BID) | OROMUCOSAL | Status: DC
  Administered 2014-10-13 – 2014-10-18 (×11): 15 mL via OROMUCOSAL
  Filled 2014-10-13 (×12): qty 15

## 2014-10-13 MED ORDER — PRO-STAT SUGAR FREE PO LIQD
30.0000 mL | Freq: Two times a day (BID) | ORAL | Status: DC
  Filled 2014-10-13 (×2): qty 30

## 2014-10-13 MED ORDER — DEXTROSE-NACL 5-0.45 % IV SOLN
INTRAVENOUS | Status: DC
  Administered 2014-10-13 – 2014-10-14 (×2): via INTRAVENOUS

## 2014-10-13 MED ORDER — DEXMEDETOMIDINE HCL IN NACL 400 MCG/100ML IV SOLN
0.4000 ug/kg/h | INTRAVENOUS | Status: DC
  Administered 2014-10-14: 0.6 ug/kg/h via INTRAVENOUS
  Administered 2014-10-14: 1 ug/kg/h via INTRAVENOUS
  Administered 2014-10-14: 1.2 ug/kg/h via INTRAVENOUS
  Administered 2014-10-14: 1 ug/kg/h via INTRAVENOUS
  Administered 2014-10-14: 0.9 ug/kg/h via INTRAVENOUS
  Administered 2014-10-14: 0.5 ug/kg/h via INTRAVENOUS
  Administered 2014-10-15: 0.6 ug/kg/h via INTRAVENOUS
  Administered 2014-10-15: 0.8 ug/kg/h via INTRAVENOUS
  Administered 2014-10-15: 1.2 ug/kg/h via INTRAVENOUS
  Administered 2014-10-15: 0.8 ug/kg/h via INTRAVENOUS
  Administered 2014-10-15: 0.9 ug/kg/h via INTRAVENOUS
  Administered 2014-10-15: 0.6 ug/kg/h via INTRAVENOUS
  Administered 2014-10-16 (×2): 1.2 ug/kg/h via INTRAVENOUS
  Administered 2014-10-16: 1 ug/kg/h via INTRAVENOUS
  Administered 2014-10-16: 0.5 ug/kg/h via INTRAVENOUS
  Administered 2014-10-16: 0.8 ug/kg/h via INTRAVENOUS
  Administered 2014-10-16: 1.2 ug/kg/h via INTRAVENOUS
  Administered 2014-10-17: 0.9 ug/kg/h via INTRAVENOUS
  Administered 2014-10-17: 0.4 ug/kg/h via INTRAVENOUS
  Administered 2014-10-17: 1 ug/kg/h via INTRAVENOUS
  Administered 2014-10-17 (×3): 0.5 ug/kg/h via INTRAVENOUS
  Administered 2014-10-17: 1 ug/kg/h via INTRAVENOUS
  Administered 2014-10-18 (×2): 0.5 ug/kg/h via INTRAVENOUS
  Filled 2014-10-13: qty 200
  Filled 2014-10-13 (×3): qty 100
  Filled 2014-10-13: qty 200
  Filled 2014-10-13 (×2): qty 100
  Filled 2014-10-13: qty 200
  Filled 2014-10-13 (×2): qty 100
  Filled 2014-10-13: qty 200
  Filled 2014-10-13 (×10): qty 100
  Filled 2014-10-13: qty 200

## 2014-10-13 MED ORDER — VITAL HIGH PROTEIN PO LIQD
1000.0000 mL | ORAL | Status: DC
  Administered 2014-10-13: 1000 mL
  Filled 2014-10-13 (×2): qty 1000

## 2014-10-13 MED ORDER — ETOMIDATE 2 MG/ML IV SOLN
20.0000 mg | Freq: Once | INTRAVENOUS | Status: AC
  Administered 2014-10-13: 20 mg via INTRAVENOUS

## 2014-10-13 MED ORDER — CETYLPYRIDINIUM CHLORIDE 0.05 % MT LIQD
7.0000 mL | Freq: Four times a day (QID) | OROMUCOSAL | Status: DC
  Administered 2014-10-13 – 2014-10-18 (×19): 7 mL via OROMUCOSAL

## 2014-10-13 MED ORDER — MIDAZOLAM HCL 2 MG/2ML IJ SOLN
2.0000 mg | Freq: Once | INTRAMUSCULAR | Status: AC
  Administered 2014-10-13: 2 mg via INTRAVENOUS

## 2014-10-13 MED ORDER — FENTANYL CITRATE (PF) 100 MCG/2ML IJ SOLN
50.0000 ug | INTRAMUSCULAR | Status: DC | PRN
  Administered 2014-10-14 – 2014-10-15 (×10): 50 ug via INTRAVENOUS
  Filled 2014-10-13 (×9): qty 2

## 2014-10-13 MED ORDER — MIDAZOLAM HCL 2 MG/2ML IJ SOLN
INTRAMUSCULAR | Status: AC
  Filled 2014-10-13: qty 4

## 2014-10-13 MED ORDER — MIDAZOLAM HCL 2 MG/2ML IJ SOLN
1.0000 mg | INTRAMUSCULAR | Status: DC | PRN
  Administered 2014-10-14 – 2014-10-15 (×8): 1 mg via INTRAVENOUS
  Filled 2014-10-13 (×8): qty 2

## 2014-10-13 NOTE — Progress Notes (Signed)
eLink Physician-Brief Progress Note Patient Name: Cody RelicJohn W Tengan DOB: 05/09/1935 MRN: [id number]   Date of Service  10/13/2014  HPI/Events of Note  restra  eICU Interventions       Intervention Category Minor Interventions: Routine modifications to care plan (e.g. PRN medications for pain, fever)  Nelda BucksFEINSTEIN,Wealthy Danielski J. 10/13/2014, 9:06 PM

## 2014-10-13 NOTE — Progress Notes (Signed)
Initial Nutrition Assessment  DOCUMENTATION CODES:  Morbid obesity  INTERVENTION:   Tube feeding   Initiate TF via OGT with Vital High Protein at 25 ml/h and Prostat 60 ml TID on day 1; on day 2, increase to goal rate of 50 ml/h (1200 ml per day) to provide 1800 kcals, 195 gm protein, 1003 ml free water daily.  NUTRITION DIAGNOSIS:  Inadequate oral intake related to inability to eat as evidenced by NPO status.   GOAL:  Provide needs based on ASPEN/SCCM guidelines   MONITOR:  Vent status, Labs, Weight trends, TF tolerance  REASON FOR ASSESSMENT:  Consult Enteral/tube feeding initiation and management  ASSESSMENT:  [facility] with AMS and fever; [facility] for further workup . Required intubation on 5/5. Received MD Consult for TF initiation and management. OGT in place with tip in the stomach. Questionable degree of ileus or obstruction per abdominal scan.  Patient is currently intubated on ventilator support MV: 7.3 L/min Temp (24hrs), Avg:97.8 F (36.6 C), Min:97.4 F (36.3 C), Max:98.9 F (37.2 C)  Propofol: none   Height:  Ht Readings from Last 1 Encounters:  10/13/14 5\' 10"  (1.778 m)    Weight:  Wt Readings from Last 1 Encounters:  10/13/14 278 lb 10.6 oz (126.4 kg)    Ideal Body Weight:  75.5 kg  Wt Readings from Last 10 Encounters:  10/13/14 278 lb 10.6 oz (126.4 kg)  02/18/13 268 lb 12.8 oz (121.927 kg)    BMI:  Body mass index is 39.98 kg/(m^2).  Estimated Nutritional Needs:  Kcal:  1390-1770  Protein:  188 gm  Fluid:  2 L  Skin:  Reviewed, no issues  Diet Order:  Diet NPO time specified  EDUCATION NEEDS:  No education needs identified at this time   Intake/Output Summary (Last 24 hours) at 10/13/14 1506 Last data filed at 10/13/14 1400  Gross per 24 hour  Intake 2435.05 ml  Output    725 ml  Net 1710.05 ml    Last BM:  5/4   Joaquin CourtsKimberly Harris, RD, LDN, CNSC Pager  [phone] After Hours Pager [phone]

## 2014-10-13 NOTE — Procedures (Deleted)
Intubation Procedure Note Cody RelicJohn W Schmitt [id number] 01/04/1935  Procedure: Intubation Indications: Respiratory insufficiency  Procedure Details Consent: Unable to obtain consent because of altered level of consciousness. Time Out: Verified patient identification, verified procedure, site/side was marked, verified correct patient position, special equipment/implants available, medications/allergies/relevent history reviewed, required imaging and test results available.  Performed  MAC and 4 Medications:  Fentanyl 100 mcg Etomidate 20 mg Versed 2 mg NMB    Evaluation Hemodynamic Status: BP stable throughout; O2 sats: stable throughout Patient's Current Condition: stable Complications: No apparent complications Patient did tolerate procedure well. Chest X-ray ordered to verify placement.  CXR: pending.   Brett CanalesSteve Nathanial Arrighi ACNP [facility] Pager [phone] till 3 pm If no answer page [phone] 10/13/2014, 12:01 PM

## 2014-10-13 NOTE — Procedures (Signed)
Intubation Procedure Note Cody Schmitt [id number] 07/19/1934  Procedure: Intubation Indications: Respiratory insufficiency  Procedure Details Consent: Unable to obtain consent because of altered level of consciousness. Time Out: Verified patient identification, verified procedure, site/side was marked, verified correct patient position, special equipment/implants available, medications/allergies/relevent history reviewed, required imaging and test results available.  Performed  Maximum sterile technique was used including gloves, hand hygiene and mask.  MAC and 4  Given 2 mg versed, 50 mcg fentanyl, 10 mg etomidate.  Inserted #8 ETT to 23 cm at lip using glidescope.  Confirmed by auscultation and CO2 detector.  Purulent secretions from ETT.  Evaluation Hemodynamic Status: BP stable throughout; O2 sats: stable throughout Patient's Current Condition: stable Complications: No apparent complications Patient did tolerate procedure well. Chest X-ray ordered to verify placement.  CXR: pending.   Cody HellingVineet Coston Mandato, MD [facility] 10/13/2014, 12:10 PM Pager:  [phone] After 3pm call: [phone]

## 2014-10-13 NOTE — Progress Notes (Signed)
eLink Physician-Brief Progress Note Patient Name: Cody Schmitt DOB: 09/14/1934 MRN: [id number]   Date of Service  10/13/2014  HPI/Events of Note  20 cc/hr, crt wnl cvp 20, NO TR onm last echo  eICU Interventions  Hold bolus for this Assess volume status, UA, urine na, osm further Was already 1.7 lit up today alone     Intervention Category Intermediate Interventions: Oliguria - evaluation and management  Nelda BucksFEINSTEIN,DANIEL J. 10/13/2014, 5:25 PM

## 2014-10-13 NOTE — Progress Notes (Signed)
PULMONARY / CRITICAL CARE MEDICINE   Name: Cody Schmitt MRN: [id number] DOB: 12/12/1934    ADMISSION DATE:  10/10/2014  REF[facility] MD :  [facility]  CHIEF COMPLAINT:  AMS  INITIAL PRESENTATION:  80 year old male [facility] with AMS and fever. No identifiable source for infection and [facility].  STUDIES:  CT head 5/2 > no acute intracranial issues RUQ u/s 5/03 > cholelithiasis, diffuse coarsening of hepatic parenchymal structure Echo 5/04 > EF 55 to 60%, PAS 39 mmHg  SIGNIFICANT EVENTS: 5/2 [facility]4 Neuro consulted 5/5 intubated  SUBJECTIVE:  Remains on pressors, precedex.  Increased respiratory secretions.  VITAL SIGNS: Temp:  [97.4 F (36.3 C)-98.9 F (37.2 C)] 98.5 F (36.9 C) (05/05 0805) Pulse Rate:  [62-103] 64 (05/05 1100) Resp:  [11-31] 13 (05/05 1100) BP: (85-158)/(34-105) 103/50 mmHg (05/05 1100) SpO2:  [88 %-100 %] 97 % (05/05 1100) Weight:  [278 lb 10.6 oz (126.4 kg)] 278 lb 10.6 oz (126.4 kg) (05/05 0427) HEMODYNAMICS: CVP:  [11 mmHg-18 mmHg] 18 mmHg INTAKE / OUTPUT:  Intake/Output Summary (Last 24 hours) at 10/13/14 1139 Last data filed at 10/13/14 1000  Gross per 24 hour  Intake 2581.75 ml  Output    965 ml  Net 1616.75 ml    PHYSICAL EXAMINATION: General: no distress Neuro:  RASS -2  HEENT: no sinus tenderness Cardiovascular: regular Lungs: b/l crackles Abdomen:  Soft, non tender Musculoskeletal: 2+ edema Skin: no rashes  LABS:  CBC  Recent Labs Lab 10/11/14 0415 10/12/14 0515 10/13/14 0225  WBC 13.9* 13.2* 9.6  HGB 9.6* 10.1* 9.9*  HCT 30.3* 32.0* 31.0*  PLT 117* 105* 118*   Coag's  Recent Labs Lab 10/10/14 1930  INR 1.44   BMET  Recent Labs Lab 10/11/14 0415 10/12/14 0515 10/13/14 0225  NA 136 141 145  K 4.1 4.1 3.7  CL 103 111 117*  CO2 24 22 19*  BUN 24* 27* 28*  CREATININE 1.51* 1.40* 1.23  GLUCOSE 152* 174* 148*    Electrolytes  Recent Labs Lab 10/10/14 1930 10/11/14 0415 10/12/14 0515 10/13/14 0225  CALCIUM 8.1* 8.4* 8.3* 8.3*  MG 1.6*  --   --   --   PHOS 3.3  --   --   --    Sepsis Markers  Recent Labs Lab 10/10/14 1930 10/11/14 0415 10/12/14 0515  LATICACIDVEN 1.5  --   --   PROCALCITON 1.92  1.97 1.67 1.64   Liver Enzymes  Recent Labs Lab 10/10/14 1930 10/12/14 0515  AST 62* 44*  ALT 40 36  ALKPHOS 114 93  BILITOT 1.5* 1.6*  ALBUMIN 2.3* 2.1*   Cardiac Enzymes  Recent Labs Lab 10/10/14 1930 10/10/14 2215 10/11/14 0415  TROPONINI 0.13* 0.13* 0.17*   Glucose  Recent Labs Lab 10/12/14 1158 10/12/14 1557 10/12/14 2013 10/13/14 10/13/14 0423 10/13/14 0745  GLUCAP 183* 164* 148* 127* 161* 162*    Imaging No results found.  Lab Results  Component Value Date   TSH 1.156 10/10/2014    ASSESSMENT / PLAN:  PULMONARY A: Acute respiratory failure 2nd to aspiration. Hx of asthma. P:   Intubate F/u CXR, ABG Continue brovana, pulmicort and prn albuterol  CARDIOVASCULAR Rt Haynes CVL 5/02 >>  A:  Septic shock. Hx of HTN. NSTEMI. P:  Wean pressors to keep MAP > 65 Holding ASA, lasix, KCl, HCTZ, losartan, metoprolol, red yeast rice  RENAL A:   CKD stage 3. Anion  gap metabolic acidosis >> no osmolar gap >> resolved. Hyperchloremic non gap acidosis. P:   Change IV fluids to D5 1/2 NS Monitor renal fx urine outpt  GASTROINTESTINAL A:   Abdominal tenderness >> improved 5/03. Hx of Hep C. P:   NPO IV Protonix for SUP Tube feeds once on vent  HEMATOLOGIC A:   Anemia, thrombocytopenia of critical illness. P:  F/u CBC SQ heparin for DVT prevention  INFECTIOUS A:   ?sepsis >> no obvious source; doubt meningitis/encephalitis. P:   Day 4 of Abx, currently on vancomycin, cefepime  Sputum 5/05 >> CSF 5/05 >>  ENDOCRINE A:   DM. P:   SSI Holding home onglyza  NEUROLOGIC A:   Acute encephalopathy cause uncertain >> doubt  meningitis/encephalitis. Elevated ammonia. P:   Wean off precedex for RASS goal 0 Added lactulose 5/03 F/u EEG Will need LP by IR Will need MRI  D/w pts wife over phone.  CC time 35 minutes.  Coralyn HellingVineet Delawrence Fridman, MD [facility] 10/13/2014, 11:39 AM Pager:  [phone] After 3pm call: [phone]

## 2014-10-13 NOTE — Progress Notes (Signed)
Subjective: Patient remains altered.  Does not follow commands.  At times has appropriate conversation and at other times just mumbles or repeats one word over and over.  Patient on Precedex.  Objective: Current vital signs: BP 103/50 mmHg  Pulse 64  Temp(Src) 98.5 F (36.9 C) (Oral)  Resp 13  Ht 5' 4.96" (1.65 m)  Wt 126.4 kg (278 lb 10.6 oz)  BMI 46.43 kg/m2  SpO2 97% Vital signs in last 24 hours: Temp:  [97.4 F (36.3 C)-98.9 F (37.2 C)] 98.5 F (36.9 C) (05/05 0805) Pulse Rate:  [62-103] 64 (05/05 1100) Resp:  [11-31] 13 (05/05 1100) BP: (85-158)/(34-105) 103/50 mmHg (05/05 1100) SpO2:  [88 %-100 %] 97 % (05/05 1100) Weight:  [126.4 kg (278 lb 10.6 oz)] 126.4 kg (278 lb 10.6 oz) (05/05 0427)  Intake/Output from previous day: 05/04 0701 - 05/05 0700 In: 2695.8 [I.V.:2145.8; IV Piggyback:550] Out: 1095 [Urine:1095] Intake/Output this shift: Total I/O In: 294.1 [I.V.:294.1] Out: 40 [Urine:40] Nutritional status: Diet NPO time specified  Neurologic Exam: Mental Status: Eyes closed and lethargic.  Mumbles with deep sternal rub.  Does not follow commands. Cranial Nerves: II: Discs flat bilaterally; no blink to threat, pupils equal 2mm, round, reactive to light and accommodation III,IV, VI: ptosis not present, oculocephalic reflex not intact.  V,VII: face symmetric with grimace,corneal reflex intact bilaterally VIII: unable to test IX,X: unable to visualize XI: bilateral shoulder shrug XII: unable to test Motor: In restraints but attempts to localize to pain with both upper extremities Sensory: no response to noxious stimuli in all 4 extremities.  Deep Tendon Reflexes: 1+ and symmetric throughout UE no KJ or AJ Plantars: Right: downgoingLeft: downgoing   Lab Results: Basic Metabolic Panel:  Recent Labs Lab 10/10/14 1930 10/11/14 0415 10/12/14 0515 10/13/14 0225  NA 136 136 141 145  K 3.8 4.1 4.1 3.7  CL 106 103 111 117*   CO2 25 24 22  19*  GLUCOSE 143* 152* 174* 148*  BUN 24* 24* 27* 28*  CREATININE 1.53* 1.51* 1.40* 1.23  CALCIUM 8.1* 8.4* 8.3* 8.3*  MG 1.6*  --   --   --   PHOS 3.3  --   --   --     Liver Function Tests:  Recent Labs Lab 10/10/14 1930 10/12/14 0515  AST 62* 44*  ALT 40 36  ALKPHOS 114 93  BILITOT 1.5* 1.6*  PROT 5.7* 5.5*  ALBUMIN 2.3* 2.1*    Recent Labs Lab 10/10/14 1930  LIPASE 21*  AMYLASE 28    Recent Labs Lab 10/10/14 1930 10/11/14 1152  AMMONIA 50* 15    CBC:  Recent Labs Lab 10/10/14 1930 10/11/14 0415 10/12/14 0515 10/13/14 0225  WBC 11.3* 13.9* 13.2* 9.6  NEUTROABS 8.8*  --   --   --   HGB 9.1* 9.6* 10.1* 9.9*  HCT 27.7* 30.3* 32.0* 31.0*  MCV 85.0 85.8 86.5 85.9  PLT 84* 117* 105* 118*    Cardiac Enzymes:  Recent Labs Lab 10/10/14 1930 10/10/14 2215 10/11/14 0415  TROPONINI 0.13* 0.13* 0.17*    Lipid Panel: No results for input(s): CHOL, TRIG, HDL, CHOLHDL, VLDL, LDLCALC in the last 168 hours.  CBG:  Recent Labs Lab 10/12/14 1557 10/12/14 2013 10/13/14 10/13/14 0423 10/13/14 0745  GLUCAP 164* 148* 127* 161* 162*    Microbiology: Results for orders placed or performed during the hospital encounter of 10/10/14  MRSA PCR Screening     Status: None   Collection Time: 10/10/14  3:40 PM  Result Value Ref Range Status   MRSA by PCR NEGATIVE NEGATIVE Final    Comment:        The GeneXpert MRSA Assay (FDA approved for NASAL specimens only), is one component of a comprehensive MRSA colonization surveillance program. It is not intended to diagnose MRSA infection nor to guide or monitor treatment for MRSA infections.     Coagulation Studies:  Recent Labs  10/10/14 1930  LABPROT 17.7*  INR 1.44    Imaging: Dg Chest Port 1 View  10/13/2014   CLINICAL DATA:  Dyspnea  EXAM: PORTABLE CHEST - 1 VIEW  COMPARISON:  10/10/2014  FINDINGS: There is a right subclavian central line with tip in the SVC. There is no  pneumothorax. There is new confluent right upper lobe consolidation. This could represent pneumonia. However, it could also represent asymmetric pulmonary edema as there are moderate congestive changes in the vasculature and interstitium. No large effusion is evident. There is unchanged mild cardiomegaly.  IMPRESSION: New confluent airspace opacity in the right upper lobe. Ground-glass opacities elsewhere in the central and basilar lungs. This could represent congestive heart failure with asymmetric edema. Infectious infiltrate cannot be excluded   Electronically Signed   By: Ellery Plunkaniel R Mitchell M.D.   On: 10/13/2014 05:34    Medications:  I have reviewed the patient's current medications. Scheduled: . antiseptic oral rinse  7 mL Mouth Rinse q12n4p  . arformoterol  15 mcg Nebulization BID  . budesonide  0.25 mg Nebulization BID  . ceFEPime (MAXIPIME) IV  2 g Intravenous Q24H  . chlorhexidine  15 mL Mouth Rinse BID  . heparin  5,000 Units Subcutaneous 3 times per day  . insulin aspart  2-6 Units Subcutaneous 6 times per day  . lactulose  300 mL Rectal Daily  . pantoprazole (PROTONIX) IV  40 mg Intravenous QHS  . vancomycin  1,500 mg Intravenous Q24H    Assessment/Plan: Mental status remains altered but patient remains afebrile and white blood cell count now normal.  EEG pending.  Recommendations: 1.  Will continue to follow with you.    LOS: 3 days   Thana FarrLeslie Zohal Reny, MD [facility] [phone] 10/13/2014  11:10 AM

## 2014-10-13 NOTE — Progress Notes (Signed)
ANTIBIOTIC CONSULT NOTE - FOLLOW UP  Pharmacy Consult for vancomycin, cefepime Indication: rule out sepsis and unknown source  Allergies  Allergen Reactions  . Lisinopril Cough  . Statins Nausea And Vomiting  . Zetia [Ezetimibe] Nausea And Vomiting  . Coumadin [Warfarin Sodium] Rash  . Dilaudid [Hydromorphone Hcl] Itching and Rash    Patient Measurements: Height: 5' 4.96" (165 cm) Weight: 278 lb 10.6 oz (126.4 kg) IBW/kg (Calculated) : 61.41 Vital Signs: Temp: 98.9 F (37.2 C) (05/05 1144) Temp Source: Oral (05/05 1144) BP: 103/50 mmHg (05/05 1100) Pulse Rate: 64 (05/05 1100) Intake/Output from previous day: 05/04 0701 - 05/05 0700 In: 2695.8 [I.V.:2145.8; IV Piggyback:550] Out: 1095 [Urine:1095] Intake/Output from this shift: Total I/O In: 294.1 [I.V.:294.1] Out: 40 [Urine:40]  Labs:  Recent Labs  10/11/14 0415 10/12/14 0515 10/13/14 0225  WBC 13.9* 13.2* 9.6  HGB 9.6* 10.1* 9.9*  PLT 117* 105* 118*  CREATININE 1.51* 1.40* 1.23   Estimated Creatinine Clearance: 62.2 mL/min (by C-G formula based on Cr of 1.23). No results for input(s): VANCOTROUGH, VANCOPEAK, VANCORANDOM, GENTTROUGH, GENTPEAK, GENTRANDOM, TOBRATROUGH, TOBRAPEAK, TOBRARND, AMIKACINPEAK, AMIKACINTROU, AMIKACIN in the last 72 hours.   Microbiology: Recent Results (from the past 720 hour(s))  MRSA PCR Screening     Status: None   Collection Time: 10/10/14  3:40 PM  Result Value Ref Range Status   MRSA by PCR NEGATIVE NEGATIVE Final    Comment:        The GeneXpert MRSA Assay (FDA approved for NASAL specimens only), is one component of a comprehensive MRSA colonization surveillance program. It is not intended to diagnose MRSA infection nor to guide or monitor treatment for MRSA infections.     Anti-infectives    Start     Dose/Rate Route Frequency Ordered Stop   10/11/14 0000  vancomycin (VANCOCIN) 1,500 mg in sodium chloride 0.9 % 500 mL IVPB     1,500 mg 250 mL/hr over 120 Minutes  Intravenous Every 24 hours 10/10/14 1814     10/10/14 2000  acyclovir (ZOVIRAX) 615 mg in dextrose 5 % 100 mL IVPB  Status:  Discontinued     10 mg/kg  61.4 kg (Ideal) 112.3 mL/hr over 60 Minutes Intravenous Every 8 hours 10/10/14 1814 10/11/14 1027   10/10/14 1900  ceFEPIme (MAXIPIME) 2 g in dextrose 5 % 50 mL IVPB  Status:  Discontinued     2 g 100 mL/hr over 30 Minutes Intravenous  Once 10/10/14 1747 10/10/14 1814   10/10/14 1900  ceFEPIme (MAXIPIME) 2 g in dextrose 5 % 50 mL IVPB     2 g 100 mL/hr over 30 Minutes Intravenous Every 24 hours 10/10/14 1814        Assessment: 80 year old male on day #4 of vancomycin and cefepime for r/o sepsis/unknown source. Plan to intubate today for LP and MRI to help r/o meningitis. 5/3- US Abd- cholelithiasis. WBC down 9.6. LA wnl, PCT 1.67 (trend down). Afeb. SCr down to 1.23 but poor UOP & net + 4.5L ([facility] est CrCl 60-7065ml/min.   5/2 Vanc>> 5/2 Cefepime>> 5/2 Acyclovir>>5/3  BCx2 5/2 ([facility]) >>>called 5/4 -ngtd UC 5/2 ([facility]) >>>called 5/4 - ngtd MRSA PCR neg   Goal of Therapy:  Vancomycin trough level 15-20 mcg/ml  Plan:  - Continue Cefepime 2g IV q24h with poor UOP and monitor- if continued improvement in renal function, consider increase to q12 dosing.  - Continue Vancomycin 1500 mg IV q24h - will check VT prior to  next dose - F/u renal function, micro data, pt's clinical condition - F/u LP results  Link SnufferJessica Roe Koffman, PharmD, BCPS Clinical Pharmacist [phone] 10/13/2014,11:47 AM

## 2014-10-13 NOTE — Progress Notes (Signed)
EEG Completed; Results Pending  

## 2014-10-13 NOTE — Procedures (Signed)
ELECTROENCEPHALOGRAM REPORT   Patient: Cody Schmitt      Room #: 80M-11 Age: 80 y.o.        Sex: male Referring Physician: Dr Kendrick FriesMcQuaid Report Date:  10/13/2014        Interpreting Physician: Omelia BlackwaterSUMNER, Laure Leone JUSTIN  History: Cody Schmitt is an 80 y.o. male admitted with fever and AMS  Medications:  Continuous: . dexmedetomidine 0.7 mcg/kg/hr (10/13/14 1202)  . dextrose 5 % and 0.45% NaCl 50 mL/hr at 10/13/14 1304  . norepinephrine (LEVOPHED) Adult infusion 1 mcg/min (10/13/14 0501)    Conditions of Recording:  This is a 16 channel EEG carried out with the patient in the intubated sedated state on precedex  Description:  The background activity consists of a very low voltage, symmetrical, poorly organized, theta activity, seen from the parieto-occipital and posterior temporal regions. No posterior dominant alpha rhythm is noted. Low voltage fast activity, poorly organized, is seen anteriorly and is at times superimposed on more posterior regions. No focal slowing or epileptiform activity is noted.  Normal sleep architecture is not observed. Hyperventilation and intermittent photic stimulation was not observed.   IMPRESSION: This is an abnormal EEG secondary to general background slowing indicating a moderate to severe cerebral disturbance (encephalopathy).  This finding may be seen with a diffuse disturbance that is etiologically nonspecific, but may include medication effect. No epileptiform activity was noted.     Elspeth Choeter Dontee Jaso, DO [facility] [phone]  If 7pm- 7am, please page neurology on call as listed in AMION. 10/13/2014, 1:39 PM

## 2014-10-14 ENCOUNTER — Inpatient Hospital Stay (HOSPITAL_COMMUNITY): Payer: Medicare Other

## 2014-10-14 DIAGNOSIS — M7989 Other specified soft tissue disorders: Secondary | ICD-10-CM

## 2014-10-14 LAB — COMPREHENSIVE METABOLIC PANEL
ALK PHOS: 95 U/L (ref 38–126)
ALT: 35 U/L (ref 17–63)
AST: 44 U/L — ABNORMAL HIGH (ref 15–41)
Albumin: 1.9 g/dL — ABNORMAL LOW (ref 3.5–5.0)
Anion gap: 7 (ref 5–15)
BUN: 34 mg/dL — AB (ref 6–20)
CALCIUM: 8.6 mg/dL — AB (ref 8.9–10.3)
CO2: 20 mmol/L — ABNORMAL LOW (ref 22–32)
Chloride: 124 mmol/L — ABNORMAL HIGH (ref 101–111)
Creatinine, Ser: 1.2 mg/dL (ref 0.61–1.24)
GFR calc Af Amer: >60 mL/min (ref >60)
GFR, EST NON AFRICAN AMERICAN: 57 mL/min — AB (ref >60)
GLUCOSE: 170 mg/dL — AB (ref 70–99)
Potassium: 3.5 mmol/L (ref 3.5–5.1)
Sodium: 151 mmol/L — ABNORMAL HIGH (ref 135–145)
Total Bilirubin: 1.1 mg/dL (ref 0.3–1.2)
Total Protein: 5.1 g/dL — ABNORMAL LOW (ref 6.5–8.1)

## 2014-10-14 LAB — CBC
HCT: 29.1 % — ABNORMAL LOW (ref 39.0–52.0)
HEMOGLOBIN: 9.2 g/dL — AB (ref 13.0–17.0)
MCH: 27.5 pg (ref 26.0–34.0)
MCHC: 31.6 g/dL (ref 30.0–36.0)
MCV: 87.1 fL (ref 78.0–100.0)
Platelets: 101 10*3/uL — ABNORMAL LOW (ref 150–400)
RBC: 3.34 MIL/uL — ABNORMAL LOW (ref 4.22–5.81)
RDW: 16.7 % — AB (ref 11.5–15.5)
WBC: 4.8 10*3/uL (ref 4.0–10.5)

## 2014-10-14 LAB — GLUCOSE, CAPILLARY
GLUCOSE-CAPILLARY: 145 mg/dL — AB (ref 70–99)
Glucose-Capillary: 133 mg/dL — ABNORMAL HIGH (ref 70–99)
Glucose-Capillary: 152 mg/dL — ABNORMAL HIGH (ref 70–99)
Glucose-Capillary: 153 mg/dL — ABNORMAL HIGH (ref 70–99)
Glucose-Capillary: 159 mg/dL — ABNORMAL HIGH (ref 70–99)
Glucose-Capillary: 166 mg/dL — ABNORMAL HIGH (ref 70–99)
Glucose-Capillary: 181 mg/dL — ABNORMAL HIGH (ref 70–99)

## 2014-10-14 LAB — CSF CELL COUNT WITH DIFFERENTIAL
RBC Count, CSF: 251 /mm3 — ABNORMAL HIGH
TUBE #: 3
WBC, CSF: 1 /mm3 (ref 0–5)

## 2014-10-14 LAB — GRAM STAIN

## 2014-10-14 LAB — PROTEIN, CSF: TOTAL PROTEIN, CSF: 133 mg/dL — AB (ref 15–45)

## 2014-10-14 LAB — MAGNESIUM: Magnesium: 2.1 mg/dL (ref 1.7–2.4)

## 2014-10-14 LAB — CLOSTRIDIUM DIFFICILE BY PCR: CDIFFPCR: NEGATIVE

## 2014-10-14 LAB — VANCOMYCIN, TROUGH: Vancomycin Tr: 14 ug/mL (ref 10.0–20.0)

## 2014-10-14 LAB — PHOSPHORUS: Phosphorus: 3.3 mg/dL (ref 2.5–4.6)

## 2014-10-14 LAB — GLUCOSE, CSF: Glucose, CSF: 83 mg/dL — ABNORMAL HIGH (ref 40–70)

## 2014-10-14 MED ORDER — ATROPINE SULFATE 0.1 MG/ML IJ SOLN
INTRAMUSCULAR | Status: AC
Start: 2014-10-14 — End: 2014-10-14
  Filled 2014-10-14: qty 10

## 2014-10-14 MED ORDER — INSULIN ASPART 100 UNIT/ML ~~LOC~~ SOLN: 2.0000 [IU] | SUBCUTANEOUS | Status: DC

## 2014-10-14 MED ORDER — LIDOCAINE HCL (PF) 1 % IJ SOLN
INTRAMUSCULAR | Status: AC
  Filled 2014-10-14: qty 5

## 2014-10-14 MED ORDER — FREE WATER
200.0000 mL | Freq: Four times a day (QID) | Status: DC
  Administered 2014-10-14 – 2014-10-16 (×9): 200 mL

## 2014-10-14 MED ORDER — ASPIRIN EC 81 MG PO TBEC
81.0000 mg | DELAYED_RELEASE_TABLET | Freq: Every day | ORAL | Status: DC
  Administered 2014-10-15 – 2014-10-28 (×13): 81 mg via ORAL
  Filled 2014-10-14 (×15): qty 1

## 2014-10-14 MED ORDER — DEXTROSE 5 % IV SOLN
2.0000 g | Freq: Two times a day (BID) | INTRAVENOUS | Status: DC
  Administered 2014-10-14 – 2014-10-17 (×8): 2 g via INTRAVENOUS
  Filled 2014-10-14 (×10): qty 2

## 2014-10-14 MED ORDER — INSULIN ASPART 100 UNIT/ML ~~LOC~~ SOLN
0.0000 [IU] | SUBCUTANEOUS | Status: DC
  Administered 2014-10-14: 4 [IU] via SUBCUTANEOUS
  Administered 2014-10-14: 3 [IU] via SUBCUTANEOUS
  Administered 2014-10-14: 4 [IU] via SUBCUTANEOUS
  Administered 2014-10-15: 3 [IU] via SUBCUTANEOUS
  Administered 2014-10-15 (×2): 4 [IU] via SUBCUTANEOUS
  Administered 2014-10-15: 3 [IU] via SUBCUTANEOUS
  Administered 2014-10-15 – 2014-10-16 (×4): 4 [IU] via SUBCUTANEOUS
  Administered 2014-10-16: 3 [IU] via SUBCUTANEOUS
  Administered 2014-10-16: 7 [IU] via SUBCUTANEOUS
  Administered 2014-10-16 – 2014-10-17 (×5): 3 [IU] via SUBCUTANEOUS
  Administered 2014-10-17: 4 [IU] via SUBCUTANEOUS
  Administered 2014-10-17 – 2014-10-18 (×3): 3 [IU] via SUBCUTANEOUS

## 2014-10-14 MED ORDER — DEXTROSE 5 % IV SOLN
INTRAVENOUS | Status: DC
  Administered 2014-10-14 – 2014-10-19 (×6): via INTRAVENOUS

## 2014-10-14 NOTE — Progress Notes (Signed)
eLink Physician-Brief Progress Note Patient Name: Cody Schmitt DOB: 05/21/1935 MRN: [id number]   Date of Service  10/14/2014  HPI/Events of Note  diarrhea  eICU Interventions  flexiseal requested Send c diff     Intervention Category Intermediate Interventions: Other:  Shakoya Gilmore S. 10/14/2014, 3:48 AM

## 2014-10-14 NOTE — Progress Notes (Addendum)
ANTIBIOTIC CONSULT NOTE - FOLLOW UP  Pharmacy Consult for vancomycin, cefepime Indication: rule out sepsis and unknown source  Allergies  Allergen Reactions  . Lisinopril Cough  . Statins Nausea And Vomiting  . Zetia [Ezetimibe] Nausea And Vomiting  . Coumadin [Warfarin Sodium] Rash  . Dilaudid [Hydromorphone Hcl] Itching and Rash    Patient Measurements: Height: 5\' 10"  (177.8 cm) Weight: 278 lb 10.6 oz (126.4 kg) IBW/kg (Calculated) : 73 Vital Signs: Temp: 97.2 F (36.2 C) (05/06 0022) Temp Source: Oral (05/06 0022) BP: 129/62 mmHg (05/05 2000) Pulse Rate: 95 (05/06 0014) Intake/Output from previous day: 05/05 0701 - 05/06 0700 In: 1453.6 [I.V.:903.6; NG/GT:50; IV Piggyback:500] Out: 376 [Urine:375; Stool:1] Intake/Output from this shift: Total I/O In: 616.1 [I.V.:91.1; NG/GT:25; IV Piggyback:500] Out: 1 [Stool:1]  Labs:  Recent Labs  10/11/14 0415 10/12/14 0515 10/13/14 0225  WBC 13.9* 13.2* 9.6  HGB 9.6* 10.1* 9.9*  PLT 117* 105* 118*  CREATININE 1.51* 1.40* 1.23   Estimated Creatinine Clearance: 67.2 mL/min (by C-G formula based on Cr of 1.23).  Recent Labs  10/14/14 0113  VANCOTROUGH 14     Microbiology: Recent Results (from the past 720 hour(s))  MRSA PCR Screening     Status: None   Collection Time: 10/10/14  3:40 PM  Result Value Ref Range Status   MRSA by PCR NEGATIVE NEGATIVE Final    Comment:        The GeneXpert MRSA Assay (FDA approved for NASAL specimens only), is one component of a comprehensive MRSA colonization surveillance program. It is not intended to diagnose MRSA infection nor to guide or monitor treatment for MRSA infections.     Anti-infectives    Start     Dose/Rate Route Frequency Ordered Stop   10/11/14 0000  vancomycin (VANCOCIN) 1,500 mg in sodium chloride 0.9 % 500 mL IVPB     1,500 mg 250 mL/hr over 120 Minutes Intravenous Every 24 hours 10/10/14 1814     10/10/14 2000  acyclovir (ZOVIRAX) 615 mg in dextrose 5  % 100 mL IVPB  Status:  Discontinued     10 mg/kg  61.4 kg (Ideal) 112.3 mL/hr over 60 Minutes Intravenous Every 8 hours 10/10/14 1814 10/11/14 1027   10/10/14 1900  ceFEPIme (MAXIPIME) 2 g in dextrose 5 % 50 mL IVPB  Status:  Discontinued     2 g 100 mL/hr over 30 Minutes Intravenous  Once 10/10/14 1747 10/10/14 1814   10/10/14 1900  ceFEPIme (MAXIPIME) 2 g in dextrose 5 % 50 mL IVPB     2 g 100 mL/hr over 30 Minutes Intravenous Every 24 hours 10/10/14 1814        Assessment: Steady state vancomycin trough = 14 mcg/ml on dose of 1500 mg IV q24h.  Vanc trough was drawn 1.75 hour late due to patient off floor.  Expect true trough ~ 15 mcg/ml which is at goal of 15-20 mcg/ml. Will continue vancomycin at current dosage 1500 mg IV q6124h   80 year old male on day #5 of vancomycin and cefepime for r/o sepsis/unknown source. 5/2 Vanc>> 5/2 Cefepime>> 5/2 Acyclovir>>5/3  BCx2 5/2 ([facility]) >>>called 5/4 -ngtd UC 5/2 ([facility]) >>>called 5/4 - ngtd MRSA PCR neg   Goal of Therapy:  Vancomycin trough level 15-20 mcg/ml  Plan:  - Change Cefepime to 2g IV q12h with poor UOP and monitor- if continued improvement in renal function, consider increase to q12 dosing.  - Continue Vancomycin 1500 mg IV q24h- F/u renal function, micro data,  pt's clinical condition - F/u LP results  Noah Delaineuth Clark, RPh Clinical Pharmacist Pager: [phone] 10/14/2014,2:32 AM   Addendum: [facility] 5/6 1000. 5/3 Urine cx - negative (final) and 5/3 blood cultures no growth (not final).  With improvement in SCr and UOP 0.4 ml/kg/hr. Will change Cefepime to 2gm IV q12h.  Christoper Fabianaron Vicenta Olds, PharmD, BCPS Clinical pharmacist, pager [phone] 10/14/2014 9:50 AM

## 2014-10-14 NOTE — Progress Notes (Addendum)
Subjective: Patient intubated but able to be awakened.    Objective: Current vital signs: BP 99/44 mmHg  Pulse 64  Temp(Src) 98 F (36.7 C) (Oral)  Resp 16  Ht 5\' 10"  (1.778 m)  Wt 128 kg (282 lb 3 oz)  BMI 40.49 kg/m2  SpO2 100% Vital signs in last 24 hours: Temp:  [97.1 F (36.2 C)-98.9 F (37.2 C)] 98 F (36.7 C) (05/06 0757) Pulse Rate:  [49-102] 64 (05/06 0830) Resp:  [0-22] 16 (05/06 0830) BP: (91-155)/(39-134) 99/44 mmHg (05/06 0830) SpO2:  [91 %-100 %] 100 % (05/06 0830) FiO2 (%):  [50 %-100 %] 50 % (05/06 0830) Weight:  [128 kg (282 lb 3 oz)] 128 kg (282 lb 3 oz) (05/06 0500)  Intake/Output from previous day: 05/05 0701 - 05/06 0700 In: 3629.9 [I.V.:1704.9; NG/GT:925; IV Piggyback:1000] Out: 1207 [Urine:1205; Stool:2] Intake/Output this shift: Total I/O In: 128.7 [I.V.:103.7; NG/GT:25] Out: 60 [Urine:60] Nutritional status: Diet NPO time specified  Neurologic Exam: Mental Status: Opens his eyes when his name is called and looks toward examiner.  Follows some simple commands.  Attempts to mouth words.   Cranial Nerves: II: Blinks to confrontation bilaterally, pupils right 3 mm, left 3 mm,and reactive bilaterally III,IV,VI: doll's response present bilaterally. V,VII: corneal reflex present bilaterally  VIII: patient does not respond to verbal stimuli IX,X: gag reflex unable to be tested, XI: trapezius strength unable to test bilaterally XII: tongue strength unable to test Motor: Moves all extremities Deep Tendon Reflexes:  1+ in the upper extremities and absent in the lower extremities.     Lab Results: Basic Metabolic Panel:  Recent Labs Lab 10/10/14 1930 10/11/14 0415 10/12/14 0515 10/13/14 0225 10/13/14 1201 10/14/14 0110 10/14/14 0622  NA 136 136 141 145  --   --  151*  K 3.8 4.1 4.1 3.7  --   --  3.5  CL 106 103 111 117*  --   --  124*  CO2 25 24 22  19*  --   --  20*  GLUCOSE 143* 152* 174* 148*  --   --  170*  BUN 24* 24* 27* 28*  --    --  34*  CREATININE 1.53* 1.51* 1.40* 1.23  --   --  1.20  CALCIUM 8.1* 8.4* 8.3* 8.3*  --   --  8.6*  MG 1.6*  --   --   --  2.0 2.1  --   PHOS 3.3  --   --   --  3.5 3.3  --     Liver Function Tests:  Recent Labs Lab 10/10/14 1930 10/12/14 0515 10/14/14 0622  AST 62* 44* 44*  ALT 40 36 35  ALKPHOS 114 93 95  BILITOT 1.5* 1.6* 1.1  PROT 5.7* 5.5* 5.1*  ALBUMIN 2.3* 2.1* 1.9*    Recent Labs Lab 10/10/14 1930  LIPASE 21*  AMYLASE 28    Recent Labs Lab 10/10/14 1930 10/11/14 1152  AMMONIA 50* 15    CBC:  Recent Labs Lab 10/10/14 1930 10/11/14 0415 10/12/14 0515 10/13/14 0225 10/14/14 0622  WBC 11.3* 13.9* 13.2* 9.6 4.8  NEUTROABS 8.8*  --   --   --   --   HGB 9.1* 9.6* 10.1* 9.9* 9.2*  HCT 27.7* 30.3* 32.0* 31.0* 29.1*  MCV 85.0 85.8 86.5 85.9 87.1  PLT 84* 117* 105* 118* 101*    Cardiac Enzymes:  Recent Labs Lab 10/10/14 1930 10/10/14 2215 10/11/14 0415  TROPONINI 0.13* 0.13* 0.17*    Lipid  Panel: No results for input(s): CHOL, TRIG, HDL, CHOLHDL, VLDL, LDLCALC in the last 168 hours.  CBG:  Recent Labs Lab 10/13/14 1613 10/13/14 1957 10/14/14 0016 10/14/14 0438 10/14/14 0750  GLUCAP 173* 176* 159* 145* 166*    Microbiology: Results for orders placed or performed during the hospital encounter of 10/10/14  MRSA PCR Screening     Status: None   Collection Time: 10/10/14  3:40 PM  Result Value Ref Range Status   MRSA by PCR NEGATIVE NEGATIVE Final    Comment:        The GeneXpert MRSA Assay (FDA approved for NASAL specimens only), is one component of a comprehensive MRSA colonization surveillance program. It is not intended to diagnose MRSA infection nor to guide or monitor treatment for MRSA infections.   Culture, respiratory (NON-Expectorated)     Status: None (Preliminary result)   Collection Time: 10/13/14  4:00 PM  Result Value Ref Range Status   Specimen Description ENDOTRACHEAL  Final   Special Requests NONE  Final    Gram Stain   Final    RARE WBC PRESENT, PREDOMINANTLY MONONUCLEAR RARE SQUAMOUS EPITHELIAL CELLS PRESENT NO ORGANISMS SEEN Performed at [facility]    Culture PENDING  Incomplete   Report Status PENDING  Incomplete  Clostridium Difficile by PCR     Status: None   Collection Time: 10/14/14  4:30 AM  Result Value Ref Range Status   C difficile by pcr NEGATIVE NEGATIVE Final    Coagulation Studies: No results for input(s): LABPROT, INR in the last 72 hours.  Imaging: Mr Laqueta Jean ZO Contrast  10/14/2014   CLINICAL DATA:  Altered mental status after taking a nap, RIGHT upper quadrant pain. Assess acute encephalopathy. History of hepatitis-C, diabetes.  EXAM: MRI HEAD WITHOUT AND WITH CONTRAST  TECHNIQUE: Multiplanar, multiecho pulse sequences of the brain and surrounding structures were obtained without and with intravenous contrast.  CONTRAST:  20mL MULTIHANCE GADOBENATE DIMEGLUMINE 529 MG/ML IV SOLN  COMPARISON:  CT of the head Oct 09, 2014  FINDINGS: The ventricles and sulci are normal for patient's age. Patchy supratentorial white matter T2 hyperintensities are within normal range for patient's age. No abnormal parenchymal signal, mass lesions, mass effect. No abnormal parenchymal enhancement though coronal motion degraded sequences. No reduced diffusion to suggest acute ischemia. No susceptibility artifact to suggest hemorrhage.  No abnormal extra-axial fluid collections. No extra-axial masses nor leptomeningeal enhancement. Normal major intracranial vascular flow voids seen at the skull base.  Ocular globes and orbital contents are normal though not tailored for evaluation ; bilateral ocular lens implants. No abnormal sellar expansion. Trace paranasal sinus mucosal thickening, trace mastoid effusions. No suspicious calvarial bone marrow signal. Craniocervical junction maintained.  IMPRESSION: Normal mildly motion degraded MRI of the brain with and without contrast for age.    Electronically Signed   By: Awilda Metro   On: 10/14/2014 00:50   Dg Chest Port 1 View  10/14/2014   CLINICAL DATA:  80 year old male with a history of aspiration pneumonia.  EXAM: PORTABLE CHEST - 1 VIEW  COMPARISON:  10/13/2014  FINDINGS: Persisting right-sided airspace disease with low lung volumes. Persisting retrocardiac opacity.  Cardiomediastinal silhouette not well evaluated.  Endotracheal tube terminates approximately 1 cm from the carina. This appears to have been advanced from the comparison.  Unchanged right subclavian central catheter which appears to terminate superior vena cava.  Enteric tube projects over the mediastinum, terminating out of the field of view.  IMPRESSION: Low lung volumes  with persisting right-sided airspace disease.  Endotracheal tube terminates proximally 1 cm from the carina. This may be withdrawn 4 cm to 5 cm for better position.  These results were called by telephone at the time of interpretation on 10/14/2014 at 7:02 am to the nurse caring for the patient, Ms. Felicia Flynt who verbally acknowledged these results.  Unchanged right subclavian central line and enteric tube.  Signed,  Yvone NeuJaime S. Loreta AveWagner, DO  [facility]  [facility]   Electronically Signed   By: Gilmer MorJaime  Wagner D.O.   On: 10/14/2014 07:04   Dg Chest Port 1 View  10/13/2014   CLINICAL DATA:  Hypoxia  EXAM: PORTABLE CHEST - 1 VIEW  COMPARISON:  Study obtained earlier in the day  FINDINGS: Endotracheal tube tip is 3.3 cm above the carina. Nasogastric tube tip and side port are below the diaphragm. Central catheter tip is in the superior vena cava. No pneumothorax. There is persistent airspace consolidation throughout much of the right lung, stable. There is patchy atelectatic type change in the left base. No new opacity compared to earlier in the day. Heart is upper normal in size with pulmonary vascularity within normal limits. No adenopathy. There is degenerative  change in the thoracic spine.  IMPRESSION: Tube and catheter positions as described without pneumothorax. Stable extensive airspace opacification on the right. Patchy atelectasis left base. No new opacity compared to earlier in the day. No change in cardiac silhouette.   Electronically Signed   By: Bretta BangWilliam  Woodruff III M.D.   On: 10/13/2014 12:39   Dg Chest Port 1 View  10/13/2014   CLINICAL DATA:  Dyspnea  EXAM: PORTABLE CHEST - 1 VIEW  COMPARISON:  10/10/2014  FINDINGS: There is a right subclavian central line with tip in the SVC. There is no pneumothorax. There is new confluent right upper lobe consolidation. This could represent pneumonia. However, it could also represent asymmetric pulmonary edema as there are moderate congestive changes in the vasculature and interstitium. No large effusion is evident. There is unchanged mild cardiomegaly.  IMPRESSION: New confluent airspace opacity in the right upper lobe. Ground-glass opacities elsewhere in the central and basilar lungs. This could represent congestive heart failure with asymmetric edema. Infectious infiltrate cannot be excluded   Electronically Signed   By: Ellery Plunkaniel R Mitchell M.D.   On: 10/13/2014 05:34   Dg Abd Portable 1v  10/13/2014   CLINICAL DATA:  Orogastric tube placement  EXAM: PORTABLE ABDOMEN - 1 VIEW  COMPARISON:  None.  FINDINGS: Orogastric tube tip and side port in stomach. There are loops of mildly dilated bowel in the mid abdomen. No free air seen on this semi-erect portable study.  IMPRESSION: Orogastric tube tip and side port in stomach. Bowel gas pattern raises question of a degree of ileus or obstruction.   Electronically Signed   By: Bretta BangWilliam  Woodruff III M.D.   On: 10/13/2014 12:40    Medications:  I have reviewed the patient's current medications. Scheduled: . antiseptic oral rinse  7 mL Mouth Rinse QID  . arformoterol  15 mcg Nebulization BID  . atropine      . budesonide  0.25 mg Nebulization BID  . ceFEPime (MAXIPIME)  IV  2 g Intravenous Q24H  . chlorhexidine  15 mL Mouth Rinse BID  . feeding supplement (PRO-STAT SUGAR FREE 64)  60 mL Per Tube TID  . feeding supplement (VITAL HIGH PROTEIN)  1,000 mL Per Tube Q24H  . heparin  5,000 Units Subcutaneous  3 times per day  . insulin aspart  2-6 Units Subcutaneous 6 times per day  . lactulose  30 g Per Tube Daily  . pantoprazole sodium  40 mg Per Tube Q24H  . vancomycin  1,500 mg Intravenous Q24H    Assessment/Plan: Patient now intubated, making full exam difficult.  Patient now able to follow commands though, which is improved.  MRI of the brain reviewed and although limited due to movement does no shows any significant abnormalities.  LP pending.    Recommendations: 1.  Will continue to follow with you   LOS: 4 days   Thana Farr, MD [facility] [phone] 10/14/2014  9:01 AM

## 2014-10-14 NOTE — Progress Notes (Signed)
*  Preliminary Results* Left lower extremity venous duplex completed. Left lower extremity is negative for deep vein thrombosis. There is no evidence of left Baker's cyst.  10/14/2014 12:07 PM  Gertie FeyMichelle Joy Reiger, RVT, RDCS, RDMS

## 2014-10-14 NOTE — Progress Notes (Signed)
PULMONARY / CRITICAL CARE MEDICINE   Name: Cody Schmitt MRN: [id number] DOB: 1935-03-21    ADMISSION DATE:  10/10/2014  REFERRING MD :  Maryruth Bun EDP  CHIEF COMPLAINT:  AMS  INITIAL PRESENTATION:  80 year old male presented to [facility] hospital with AMS and fever. No identifiable source for infection and was transferred to [facility] for further workup.  STUDIES:  CT head 5/2 > no acute intracranial issues RUQ u/s 5/03 > cholelithiasis, diffuse coarsening of hepatic parenchymal structure Echo 5/04 > EF 55 to 60%, PAS 39 mmHg EEG 5/05 >> generalized slowing MRI brain 5/05 >> normal  SIGNIFICANT EVENTS: 5/2 Transfer from [facility] 5/4 Neuro consulted 5/5 intubated 5/6 off pressors  SUBJECTIVE:  RN reports he was following commands intermittently overnight.  Off pressors.  VITAL SIGNS: Temp:  [97.1 F (36.2 C)-98.9 F (37.2 C)] 98 F (36.7 C) (05/06 0757) Pulse Rate:  [49-102] 68 (05/06 0900) Resp:  [0-22] 16 (05/06 0900) BP: (91-155)/(39-134) 99/47 mmHg (05/06 0900) SpO2:  [91 %-100 %] 100 % (05/06 0900) FiO2 (%):  [50 %-100 %] 50 % (05/06 0900) Weight:  [282 lb 3 oz (128 kg)] 282 lb 3 oz (128 kg) (05/06 0500) HEMODYNAMICS: CVP:  [20 mmHg-22 mmHg] 21 mmHg INTAKE / OUTPUT:  Intake/Output Summary (Last 24 hours) at 10/14/14 0955 Last data filed at 10/14/14 0900  Gross per 24 hour  Intake 3666.28 ml  Output   1287 ml  Net 2379.28 ml    PHYSICAL EXAMINATION: General: no distress Neuro:  RASS -2 HEENT: no sinus tenderness Cardiovascular: regular Lungs: b/l crackles Abdomen:  Soft, non tender Musculoskeletal: Lt > Rt Lower edema Skin: no rashes  LABS:  CBC  Recent Labs Lab 10/12/14 0515 10/13/14 0225 10/14/14 0622  WBC 13.2* 9.6 4.8  HGB 10.1* 9.9* 9.2*  HCT 32.0* 31.0* 29.1*  PLT 105* 118* 101*   Coag's  Recent Labs Lab 10/10/14 1930  INR 1.44   BMET  Recent Labs Lab 10/12/14 0515 10/13/14 0225 10/14/14 0622  NA 141 145 151*  K 4.1 3.7 3.5   CL 111 117* 124*  CO2 22 19* 20*  BUN 27* 28* 34*  CREATININE 1.40* 1.23 1.20  GLUCOSE 174* 148* 170*   Electrolytes  Recent Labs Lab 10/10/14 1930  10/12/14 0515 10/13/14 0225 10/13/14 1201 10/14/14 0110 10/14/14 0622  CALCIUM 8.1*  < > 8.3* 8.3*  --   --  8.6*  MG 1.6*  --   --   --  2.0 2.1  --   PHOS 3.3  --   --   --  3.5 3.3  --   < > = values in this interval not displayed.   Sepsis Markers  Recent Labs Lab 10/10/14 1930 10/11/14 0415 10/12/14 0515  LATICACIDVEN 1.5  --   --   PROCALCITON 1.92  1.97 1.67 1.64   Liver Enzymes  Recent Labs Lab 10/10/14 1930 10/12/14 0515 10/14/14 0622  AST 62* 44* 44*  ALT 40 36 35  ALKPHOS 114 93 95  BILITOT 1.5* 1.6* 1.1  ALBUMIN 2.3* 2.1* 1.9*   Cardiac Enzymes  Recent Labs Lab 10/10/14 1930 10/10/14 2215 10/11/14 0415  TROPONINI 0.13* 0.13* 0.17*   Glucose  Recent Labs Lab 10/13/14 1141 10/13/14 1613 10/13/14 1957 10/14/14 0016 10/14/14 0438 10/14/14 0750  GLUCAP 141* 173* 176* 159* 145* 166*    Imaging Mr Brain W Wo Contrast  10/14/2014   CLINICAL DATA:  Altered mental status after taking a nap, RIGHT upper  quadrant pain. Assess acute encephalopathy. History of hepatitis-C, diabetes.  EXAM: MRI HEAD WITHOUT AND WITH CONTRAST  TECHNIQUE: Multiplanar, multiecho pulse sequences of the brain and surrounding structures were obtained without and with intravenous contrast.  CONTRAST:  20mL MULTIHANCE GADOBENATE DIMEGLUMINE 529 MG/ML IV SOLN  COMPARISON:  CT of the head Oct 09, 2014  FINDINGS: The ventricles and sulci are normal for patient's age. Patchy supratentorial white matter T2 hyperintensities are within normal range for patient's age. No abnormal parenchymal signal, mass lesions, mass effect. No abnormal parenchymal enhancement though coronal motion degraded sequences. No reduced diffusion to suggest acute ischemia. No susceptibility artifact to suggest hemorrhage.  No abnormal extra-axial fluid  collections. No extra-axial masses nor leptomeningeal enhancement. Normal major intracranial vascular flow voids seen at the skull base.  Ocular globes and orbital contents are normal though not tailored for evaluation ; bilateral ocular lens implants. No abnormal sellar expansion. Trace paranasal sinus mucosal thickening, trace mastoid effusions. No suspicious calvarial bone marrow signal. Craniocervical junction maintained.  IMPRESSION: Normal mildly motion degraded MRI of the brain with and without contrast for age.   Electronically Signed   By: Awilda Metroourtnay  Bloomer   On: 10/14/2014 00:50   Dg Chest Port 1 View  10/13/2014   CLINICAL DATA:  Hypoxia  EXAM: PORTABLE CHEST - 1 VIEW  COMPARISON:  Study obtained earlier in the day  FINDINGS: Endotracheal tube tip is 3.3 cm above the carina. Nasogastric tube tip and side port are below the diaphragm. Central catheter tip is in the superior vena cava. No pneumothorax. There is persistent airspace consolidation throughout much of the right lung, stable. There is patchy atelectatic type change in the left base. No new opacity compared to earlier in the day. Heart is upper normal in size with pulmonary vascularity within normal limits. No adenopathy. There is degenerative change in the thoracic spine.  IMPRESSION: Tube and catheter positions as described without pneumothorax. Stable extensive airspace opacification on the right. Patchy atelectasis left base. No new opacity compared to earlier in the day. No change in cardiac silhouette.   Electronically Signed   By: Bretta BangWilliam  Woodruff III M.D.   On: 10/13/2014 12:39   Dg Chest Port 1 View  10/13/2014   CLINICAL DATA:  Dyspnea  EXAM: PORTABLE CHEST - 1 VIEW  COMPARISON:  10/10/2014  FINDINGS: There is a right subclavian central line with tip in the SVC. There is no pneumothorax. There is new confluent right upper lobe consolidation. This could represent pneumonia. However, it could also represent asymmetric pulmonary edema  as there are moderate congestive changes in the vasculature and interstitium. No large effusion is evident. There is unchanged mild cardiomegaly.  IMPRESSION: New confluent airspace opacity in the right upper lobe. Ground-glass opacities elsewhere in the central and basilar lungs. This could represent congestive heart failure with asymmetric edema. Infectious infiltrate cannot be excluded   Electronically Signed   By: Ellery Plunkaniel R Mitchell M.D.   On: 10/13/2014 05:34   Dg Abd Portable 1v  10/13/2014   CLINICAL DATA:  Orogastric tube placement  EXAM: PORTABLE ABDOMEN - 1 VIEW  COMPARISON:  None.  FINDINGS: Orogastric tube tip and side port in stomach. There are loops of mildly dilated bowel in the mid abdomen. No free air seen on this semi-erect portable study.  IMPRESSION: Orogastric tube tip and side port in stomach. Bowel gas pattern raises question of a degree of ileus or obstruction.   Electronically Signed   By: Bretta BangWilliam  Woodruff III M.D.  On: 10/13/2014 12:40    Lab Results  Component Value Date   TSH 1.156 10/10/2014    ASSESSMENT / PLAN:  PULMONARY A: Acute respiratory failure 2nd to aspiration. Hx of asthma. P:   Continue full vent support F/u CXR Continue brovana, pulmicort and prn albuterol  CARDIOVASCULAR Rt Witmer CVL 5/02 >>  A:  Septic shock >> off pressors 5/05. Hx of HTN. NSTEMI. Lt leg edema > Rt leg. P:  Wean pressors to keep MAP > 65 Resume ASA 5/06 Holding lasix, KCl, HCTZ, losartan, metoprolol, red yeast rice Doppler left leg  RENAL A:   CKD stage 3. Anion gap metabolic acidosis >> no osmolar gap >> resolved. Hyperchloremic non gap acidosis. Hypernatremia. P:   Change IV fluids to D5W 5/06 Add Free water 5/06 Monitor renal fx urine outpt  GASTROINTESTINAL A:   Abdominal tenderness >> improved 5/03. Hx of Hep C. P:   Tube feeds while on vent Protonix for SUP  HEMATOLOGIC A:   Anemia, thrombocytopenia of critical illness. P:  F/u CBC SQ heparin  for DVT prevention  INFECTIOUS A:   ?sepsis >> no obvious source; doubt meningitis/encephalitis. Diarrhea >> C diff PCR negative 5/06. Aspiration pneumonia 5/05. P:   Day 5 of Abx, currently on vancomycin, cefepime  Blood (OSH) 5/02 >> Urine (OSH) 5/02 >> negative Sputum 5/05 >> CSF 5/06 >>  ENDOCRINE A:   DM. P:   SSI Holding home onglyza  NEUROLOGIC A:   Acute encephalopathy >> cause uncertain. Elevated ammonia. P:   Wean off precedex for RASS goal 0 Added lactulose 5/03 For LP with IR 5/06  D/w pts wife over phone.  CC time 35 minutes.  Coralyn HellingVineet Carlea Badour, MD [facility] 10/14/2014, 9:55 AM Pager:  [phone] After 3pm call: [phone]

## 2014-10-14 NOTE — Significant Event (Addendum)
Patient has completed a lumbar puncture successfully. Received a total of 2mg  versed and 100mcg fentanyl during the procedure per performing MD. Post LP, MD stated no spinal precaution as far as elevation of HOB as long as patient is supine for a couple of hours. Patient returned to room safely.

## 2014-10-14 NOTE — Significant Event (Signed)
Tube feeds stopped pending lumbar puncture today.

## 2014-10-15 ENCOUNTER — Inpatient Hospital Stay (HOSPITAL_COMMUNITY): Payer: Medicare Other

## 2014-10-15 LAB — GLUCOSE, CAPILLARY
GLUCOSE-CAPILLARY: 157 mg/dL — AB (ref 70–99)
Glucose-Capillary: 140 mg/dL — ABNORMAL HIGH (ref 70–99)
Glucose-Capillary: 171 mg/dL — ABNORMAL HIGH (ref 70–99)
Glucose-Capillary: 138 mg/dL — ABNORMAL HIGH (ref 70–99)
Glucose-Capillary: 171 mg/dL — ABNORMAL HIGH (ref 70–99)

## 2014-10-15 LAB — CULTURE, RESPIRATORY

## 2014-10-15 LAB — CBC
HCT: 29.6 % — ABNORMAL LOW (ref 39.0–52.0)
Hemoglobin: 9.1 g/dL — ABNORMAL LOW (ref 13.0–17.0)
MCH: 27 pg (ref 26.0–34.0)
MCHC: 30.7 g/dL (ref 30.0–36.0)
MCV: 87.8 fL (ref 78.0–100.0)
Platelets: 91 10*3/uL — ABNORMAL LOW (ref 150–400)
RBC: 3.37 MIL/uL — ABNORMAL LOW (ref 4.22–5.81)
RDW: 17 % — AB (ref 11.5–15.5)
WBC: 4.6 10*3/uL (ref 4.0–10.5)

## 2014-10-15 LAB — BASIC METABOLIC PANEL
ANION GAP: 4 — AB (ref 5–15)
BUN: 33 mg/dL — ABNORMAL HIGH (ref 6–20)
CALCIUM: 8.2 mg/dL — AB (ref 8.9–10.3)
CHLORIDE: 122 mmol/L — AB (ref 101–111)
CO2: 21 mmol/L — AB (ref 22–32)
CREATININE: 1.07 mg/dL (ref 0.61–1.24)
GFR calc non Af Amer: >60 mL/min (ref >60)
Glucose, Bld: 159 mg/dL — ABNORMAL HIGH (ref 70–99)
Potassium: 3.1 mmol/L — ABNORMAL LOW (ref 3.5–5.1)
SODIUM: 147 mmol/L — AB (ref 135–145)

## 2014-10-15 LAB — CULTURE, RESPIRATORY W GRAM STAIN

## 2014-10-15 LAB — PHOSPHORUS: Phosphorus: 3.6 mg/dL (ref 2.5–4.6)

## 2014-10-15 LAB — MAGNESIUM: Magnesium: 2.1 mg/dL (ref 1.7–2.4)

## 2014-10-15 MED ORDER — POTASSIUM CHLORIDE 20 MEQ/15ML (10%) PO SOLN
40.0000 meq | ORAL | Status: AC
  Administered 2014-10-15 (×2): 40 meq
  Filled 2014-10-15 (×2): qty 30

## 2014-10-15 NOTE — Progress Notes (Signed)
PULMONARY / CRITICAL CARE MEDICINE   Name: Cody RelicJohn W Schmitt MRN: [id number] DOB: 10/28/1934    ADMISSION DATE:  10/10/2014  REFERRING MD :  [facility] EDP  CHIEF COMPLAINT:  AMS  INITIAL PRESENTATION:  80 year old [facility] with AMS and fever. No identifiable source for infection and was [facility].  STUDIES:  CT head 5/2 > no acute intracranial issues RUQ u/s 5/03 > cholelithiasis, diffuse coarsening of hepatic parenchymal structure Echo 5/04 > EF 55 to 60%, PAS 39 mmHg EEG 5/05 >> generalized slowing MRI brain 5/05 >> normal LP 5/06 >> glucose 83, protein 133, RBC 251, WBC 1 Doppler left leg 5/06 >> no DVT  SIGNIFICANT EVENTS: [facility]/2 [facility] Neuro consulted 5/5 intubated 5/6 off pressors  SUBJECTIVE:  Tolerating pressure support.  VITAL SIGNS: Temp:  [97.3 F (36.3 C)-99.4 F (37.4 C)] 97.3 F (36.3 C) (05/07 0809) Pulse Rate:  [52-73] 56 (05/07 0930) Resp:  [15-27] 16 (05/07 0930) BP: (80-123)/(38-80) 102/50 mmHg (05/07 0930) SpO2:  [96 %-100 %] 99 % (05/07 0930) FiO2 (%):  [40 %-50 %] 40 % (05/07 0817) Weight:  [283 lb 8.2 oz (128.6 kg)] 283 lb 8.2 oz (128.6 kg) (05/07 0421) HEMODYNAMICS: CVP:  [18 mmHg-19 mmHg] 18 mmHg INTAKE / OUTPUT:  Intake/Output Summary (Last 24 hours) at 10/15/14 1011 Last data filed at 10/15/14 0940  Gross per 24 hour  Intake 2959.28 ml  Output   1365 ml  Net 1594.28 ml    PHYSICAL EXAMINATION: General: no distress Neuro:  RASS -2 HEENT: no sinus tenderness Cardiovascular: regular Lungs: no wheeze Abdomen:  Soft, non tender Musculoskeletal: 1+ lower edema Skin: no rashes  LABS:  CBC  Recent Labs Lab 10/13/14 0225 10/14/14 0622 10/15/14 0311  WBC 9.6 4.8 4.6  HGB 9.9* 9.2* 9.1*  HCT 31.0* 29.1* 29.6*  PLT 118* 101* 91*   Coag's  Recent Labs Lab 10/10/14 1930  INR 1.44   BMET  Recent Labs Lab 10/13/14 0225 10/14/14 0622 10/15/14 0311  NA  145 151* 147*  K 3.7 3.5 3.1*  CL 117* 124* 122*  CO2 19* 20* 21*  BUN 28* 34* 33*  CREATININE 1.23 1.20 1.07  GLUCOSE 148* 170* 159*   Electrolytes  Recent Labs Lab 10/13/14 0225 10/13/14 1201 10/14/14 0110 10/14/14 0622 10/15/14 0311  CALCIUM 8.3*  --   --  8.6* 8.2*  MG  --  2.0 2.1  --  2.1  PHOS  --  3.5 3.3  --  3.6     Sepsis Markers  Recent Labs Lab 10/10/14 1930 10/11/14 0415 10/12/14 0515  LATICACIDVEN 1.5  --   --   PROCALCITON 1.92  1.97 1.67 1.64   Liver Enzymes  Recent Labs Lab 10/10/14 1930 10/12/14 0515 10/14/14 0622  AST 62* 44* 44*  ALT 40 36 35  ALKPHOS 114 93 95  BILITOT 1.5* 1.6* 1.1  ALBUMIN 2.3* 2.1* 1.9*   Cardiac Enzymes  Recent Labs Lab 10/10/14 1930 10/10/14 2215 10/11/14 0415  TROPONINI 0.13* 0.13* 0.17*   Glucose  Recent Labs Lab 10/14/14 1144 10/14/14 1621 10/14/14 1947 10/14/14 2334 10/15/14 0315 10/15/14 0812  GLUCAP 181* 152* 133* 153* 140* 157*    Imaging Dg Chest Port 1 View  10/14/2014   CLINICAL DATA:  80 year old male with a history of aspiration pneumonia.  EXAM: PORTABLE CHEST - 1 VIEW  COMPARISON:  10/13/2014  FINDINGS: Persisting right-sided airspace disease with low lung  volumes. Persisting retrocardiac opacity.  Cardiomediastinal silhouette not well evaluated.  Endotracheal tube terminates approximately 1 cm from the carina. This appears to have been advanced from the comparison.  Unchanged right subclavian central catheter which appears to terminate superior vena cava.  Enteric tube projects over the mediastinum, terminating out of the field of view.  IMPRESSION: Low lung volumes with persisting right-sided airspace disease.  Endotracheal tube terminates proximally 1 cm from the carina. This may be withdrawn 4 cm to 5 cm for better position.  These results were called by telephone at the time of interpretation on 10/14/2014 at 7:02 am to the nurse caring for the patient, Ms. Cody Schmitt who verbally  acknowledged these results.  Unchanged right subclavian central line and enteric tube.  Signed,  Yvone NeuJaime S. Loreta AveWagner, DO  [facility]  [facility]   Electronically Signed   By: Gilmer MorJaime  Wagner D.O.   On: 10/14/2014 07:04   Dg Fluoro Guide Lumbar Puncture  10/14/2014   CLINICAL DATA:  Fever and altered mental status  EXAM: DIAGNOSTIC LUMBAR PUNCTURE UNDER FLUOROSCOPIC GUIDANCE  FLUOROSCOPY TIME:  Radiation Exposure Index (as provided by the fluoroscopic device): 18.10 mGy entrance does  If the device does not provide the exposure index:  Fluoroscopy Time (in minutes and seconds):  0.8 minutes  Number of Acquired Images:  2  PROCEDURE: Informed consent was obtained from the patient's family the day before the procedure. With the patient prone, the lower back was prepped with Betadine. 1% Lidocaine was used for local anesthesia. Lumbar puncture was performed at aL4 laminectomy defect level using a 5 inch 20 gauge needle with return of clear CSF. Pressures could not be reliably obtained due to patient intubation status and prone positioning. 9 ml of CSF were obtained for laboratory studies. The patient tolerated the procedure well and there were no apparent complications.  IMPRESSION: Successful lumbar puncture with fluoroscopy.   Electronically Signed   By: Marnee SpringJonathon  Watts M.D.   On: 10/14/2014 14:11    Lab Results  Component Value Date   TSH 1.156 10/10/2014    ASSESSMENT / PLAN:  PULMONARY A: Acute respiratory failure 2nd to aspiration. Hx of asthma. P:   Pressure support wean >> not ready for extubation yet F/u CXR Continue brovana, pulmicort and prn albuterol  CARDIOVASCULAR Rt [place] CVL 5/02 >>  A:  Septic shock >> off pressors 5/05. Hx of HTN. NSTEMI. P:  Continue ASA Holding lasix, KCl, HCTZ, losartan, metoprolol, red yeast rice  RENAL A:   CKD stage 3. Anion gap metabolic acidosis >> no osmolar gap >> resolved. Hyperchloremic non gap  acidosis. Hypernatremia. Hypokalemia. P:   Changed IV fluids to D5W 5/06 Added Free water 5/06 Monitor renal fx urine outpt  GASTROINTESTINAL A:   Abdominal tenderness >> improved 5/03. Hx of Hep C. P:   Tube feeds while on vent Protonix for SUP  HEMATOLOGIC A:   Anemia, thrombocytopenia of critical illness. P:  F/u CBC SQ heparin for DVT prevention  INFECTIOUS A:   ?sepsis >> no obvious source; doubt meningitis/encephalitis. Diarrhea >> C diff PCR negative 5/06. Aspiration pneumonia 5/05. P:   Day 6 of Abx, currently on vancomycin, cefepime D/c droplet isolation  Blood (OSH) 5/02 >> Urine (OSH) 5/02 >> negative Sputum 5/05 >> CSF 5/06 >>  ENDOCRINE A:   DM. P:   SSI Holding home onglyza  NEUROLOGIC A:   Acute encephalopathy >> cause uncertain. Elevated ammonia. P:   Wean off precedex  for RASS goal 0 Added lactulose 5/03  D/w pts wife at bedside.  CC time 35 minutes.  Coralyn Helling, MD [facility] Pulmonary/Critical Care 10/15/2014, 10:11 AM Pager:  [phone] After 3pm call: [phone]

## 2014-10-15 NOTE — Progress Notes (Signed)
eLink Physician-Brief Progress Note Patient Name: Cody RelicJohn W Mcginniss DOB: 11/24/1934 MRN: [id number]   Date of Service  10/15/2014  HPI/Events of Note  Hypokalemia  eICU Interventions  Potassium replaced     Intervention Category Intermediate Interventions: Electrolyte abnormality - evaluation and management  DETERDING,ELIZABETH 10/15/2014, 4:59 AM

## 2014-10-15 NOTE — Progress Notes (Signed)
CRITICAL VALUE ALERT  Critical value received:  K 3.1  Date of notification:  10/15/14  Time of notification:  0432  Critical value read back:Yes.    Nurse who received alert:  Tory EmeraldMichelle Hezzie Karim, RN  MD notified (1st page):  Pola CornELINK MD  Time of first page:  [phone]  MD notified (2nd page):  Time of second page:  Responding MD:  Pola CornELINK MD  Time MD responded:  [phone]

## 2014-10-15 NOTE — Progress Notes (Signed)
[facility]ACEMENT PROTOCOL FOR AM LAB REPLACEMENT ONLY  The patient does not apply for the [facility] Electrolyte Replacment Protocol based on the criteria listed below:   1. Is GFR >/= 40 ml/min? Yes.    Patient's GFR today is >60 2. Is urine output >/= 0.5 ml/kg/hr for the last 6 hours? No. Patient's UOP is 0.4 ml/kg/hr 3. Is BUN < 60 mg/dL? Yes.    Patient's BUN today is 33 4. Abnormal electrolyte(s): K+3.1 5. Ordered repletion with: NA 6. If a panic level lab has been reported, has the CCM MD in charge been notified? Yes.  .   Physician:  E Deterding  Cody CowerBradshaw, Cody Schmitt 10/15/2014 4:48 AM

## 2014-10-15 NOTE — Progress Notes (Signed)
eLink Physician-Brief Progress Note Patient Name: Cody RelicJohn W Finkler DOB: 04/04/1935 MRN: [id number]   Date of Service  10/15/2014  HPI/Events of Note  Asked to renew order for restraints.   eICU Interventions  Restraint order renewed.     Intervention Category Minor Interventions: Agitation / anxiety - evaluation and management  Khloe Hunkele Eugene 10/15/2014, 8:18 PM

## 2014-10-15 NOTE — Progress Notes (Signed)
Subjective: Patient intubated on Precedex.  Agitated overnight and has received multiple doses of Fentanyl and Versed as well.    Objective: Current vital signs: BP 97/49 mmHg  Pulse 53  Temp(Src) 97.3 F (36.3 C) (Oral)  Resp 17  Ht  (1.778 m)  Wt 128.6 kg (283 lb 8.2 oz)  BMI 40.68 kg/m2  SpO2 99% Vital signs in last 24 hours: Temp:  [97.3 F (36.3 C)-99.4 F (37.4 C)] 97.3 F (36.3 C) (05/07 0809) Pulse Rate:  [52-73] 53 (05/07 0800) Resp:  [15-23] 17 (05/07 0800) BP: (80-123)/(38-70) 97/49 mmHg (05/07 0800) SpO2:  [96 %-100 %] 99 % (05/07 0800) FiO2 (%):  [40 %-50 %] 40 % (05/07 0817) Weight:  [128.6 kg (283 lb 8.2 oz)] 128.6 kg (283 lb 8.2 oz) (05/07 0421)  Intake/Output from previous day: 05/06 0701 - 05/07 0700 In: 2860.1 [I.V.:1775.1; NG/GT:485; IV Piggyback:600] Out: 1370 [Urine:995; Stool:375] Intake/Output this shift:   Nutritional status: Diet NPO time specified  Neurologic Exam: Mental Status: Patient does not respond to verbal stimuli.  With deep sternal rub begins to shake both upper extremities.  Each is in mits and restrained.  Struggles to open his eyes.  Does not follow commands.  No attempts made at verbalization.  Cranial Nerves: II: patient does not respond confrontation bilaterally, pupils right 3 mm, left 3 mm,and reactive bilaterally III,IV,VI: doll's response present bilaterally.  V,VII: corneal reflex present bilaterally  VIII: patient does not respond to verbal stimuli IX,X: gag reflex reduced, XI: trapezius strength unable to test bilaterally XII: tongue strength unable to test Motor: Moves upper extremities.  Minimal movement noted in lower extremities. Sensory: Does not respond to noxious stimuli in any extremity.   Lab Results: Basic Metabolic Panel:  Recent Labs Lab 10/10/14 1930 10/11/14 0415 10/12/14 0515 10/13/14 0225 10/13/14 1201 10/14/14 0110 10/14/14 0622 10/15/14 0311  NA 136 136 141 145  --   --  151* 147*   K 3.8 4.1 4.1 3.7  --   --  3.5 3.1*  CL 106 103 111 117*  --   --  124* 122*  CO2 19*  --   --  20* 21*  GLUCOSE 143* 152* 174* 148*  --   --  170* 159*  BUN 24* 24* 27* 28*  --   --  34* 33*  CREATININE 1.53* 1.51* 1.40* 1.23  --   --  1.20 1.07  CALCIUM 8.1* 8.4* 8.3* 8.3*  --   --  8.6* 8.2*  MG 1.6*  --   --   --  2.0 2.1  --  2.1  PHOS 3.3  --   --   --  3.5 3.3  --  3.6    Liver Function Tests:  Recent Labs Lab 10/10/14 1930 10/12/14 0515 10/14/14 0622  AST 62* 44* 44*  ALT 40 36 35  ALKPHOS 114 93 95  BILITOT 1.5* 1.6* 1.1  PROT 5.7* 5.5* 5.1*  ALBUMIN 2.3* 2.1* 1.9*    Recent Labs Lab 10/10/14 1930  LIPASE 21*  AMYLASE 28    Recent Labs Lab 10/10/14 1930 10/11/14 1152  AMMONIA 50* 15    CBC:  Recent Labs Lab 10/10/14 1930 10/11/14 0415 10/12/14 0515 10/13/14 0225 10/14/14 0622 10/15/14 0311  WBC 11.3* 13.9* 13.2* 9.6 4.8 4.6  NEUTROABS 8.8*  --   --   --   --   --   HGB 9.1* 9.6* 10.1* 9.9* 9.2* 9.1*  HCT 27.7*  30.3* 32.0* 31.0* 29.1* 29.6*  MCV 85.0 85.8 86.5 85.9 87.1 87.8  PLT 84* 117* 105* 118* 101* 91*    Cardiac Enzymes:  Recent Labs Lab 10/10/14 1930 10/10/14 2215 10/11/14 0415  TROPONINI 0.13* 0.13* 0.17*    Lipid Panel: No results for input(s): CHOL, TRIG, HDL, CHOLHDL, VLDL, LDLCALC in the last 168 hours.  CBG:  Recent Labs Lab 10/14/14 1144 10/14/14 1621 10/14/14 1947 10/14/14 2334 10/15/14 0315  GLUCAP 181* 152* 133* 153* 140*    Microbiology: Results for orders placed or performed during the hospital encounter of 10/10/14  MRSA PCR Screening     Status: None   Collection Time: 10/10/14  3:40 PM  Result Value Ref Range Status   MRSA by PCR NEGATIVE NEGATIVE Final    Comment:        The GeneXpert MRSA Assay (FDA approved for NASAL specimens only), is one component of a comprehensive MRSA colonization surveillance program. It is not intended to diagnose MRSA infection nor to guide or monitor  treatment for MRSA infections.   Culture, respiratory (NON-Expectorated)     Status: None   Collection Time: 10/13/14  4:00 PM  Result Value Ref Range Status   Specimen Description ENDOTRACHEAL  Final   Special Requests NONE  Final   Gram Stain   Final    RARE WBC PRESENT, PREDOMINANTLY MONONUCLEAR RARE SQUAMOUS EPITHELIAL CELLS PRESENT NO ORGANISMS SEEN Performed at [facility]    Culture   Final    RARE CANDIDA ALBICANS Performed at [facility]    Report Status 10/15/2014 FINAL  Final  Clostridium Difficile by PCR     Status: None   Collection Time: 10/14/14  4:30 AM  Result Value Ref Range Status   C difficile by pcr NEGATIVE NEGATIVE Final  Gram stain     Status: None   Collection Time: 10/14/14  1:42 PM  Result Value Ref Range Status   Specimen Description CSF  Final   Special Requests NO2  Final   Gram Stain   Final    CYTOSPIN PREP WBC PRESENT, PREDOMINANTLY MONONUCLEAR NO ORGANISMS SEEN    Report Status 10/14/2014 FINAL  Final  CSF culture     Status: None (Preliminary result)   Collection Time: 10/14/14  1:42 PM  Result Value Ref Range Status   Specimen Description FLUID CSF  Final   Special Requests NO2  Final   Gram Stain   Final    NO WBC SEEN NO ORGANISMS SEEN CYTOSPIN Performed at [facility]    Culture PENDING  Incomplete   Report Status PENDING  Incomplete    Coagulation Studies: No results for input(s): LABPROT, INR in the last 72 hours.  Imaging: Mr Laqueta JeanBrain W EXWo Contrast  10/14/2014   CLINICAL DATA:  Altered mental status after taking a nap, RIGHT upper quadrant pain. Assess acute encephalopathy. History of hepatitis-C, diabetes.  EXAM: MRI HEAD WITHOUT AND WITH CONTRAST  TECHNIQUE: Multiplanar, multiecho pulse sequences of the brain and surrounding structures were obtained without and with intravenous contrast.  CONTRAST:  20mL MULTIHANCE GADOBENATE DIMEGLUMINE 529 MG/ML IV SOLN  COMPARISON:  CT of the head Oct 09, 2014   FINDINGS: The ventricles and sulci are normal for patient's age. Patchy supratentorial white matter T2 hyperintensities are within normal range for patient's age. No abnormal parenchymal signal, mass lesions, mass effect. No abnormal parenchymal enhancement though coronal motion degraded sequences. No reduced diffusion to suggest acute ischemia. No susceptibility artifact to suggest  hemorrhage.  No abnormal extra-axial fluid collections. No extra-axial masses nor leptomeningeal enhancement. Normal major intracranial vascular flow voids seen at the skull base.  Ocular globes and orbital contents are normal though not tailored for evaluation ; bilateral ocular lens implants. No abnormal sellar expansion. Trace paranasal sinus mucosal thickening, trace mastoid effusions. No suspicious calvarial bone marrow signal. Craniocervical junction maintained.  IMPRESSION: Normal mildly motion degraded MRI of the brain with and without contrast for age.   Electronically Signed   By: Awilda Metro   On: 10/14/2014 00:50   Dg Chest Port 1 View  10/15/2014   CLINICAL DATA:  Aspiration pneumonia  EXAM: PORTABLE CHEST - 1 VIEW  COMPARISON:  10/14/2014  FINDINGS: Endotracheal tube tip is 7.9 cm above the carina. Nasogastric tube extends below the diaphragm and off the inferior edge of the image. There is a right subclavian central line with tip in the SVC. Right upper lobe consolidation is slightly improved, now with less densely confluent consolidation. There is unchanged mild airspace opacity in both bases. No large effusions are evident.  IMPRESSION: Partial clearance of right upper lobe consolidation. Support equipment appears satisfactorily positioned.   Electronically Signed   By: Ellery Plunk M.D.   On: 10/15/2014 07:10   Dg Chest Port 1 View  10/14/2014   CLINICAL DATA:  80 year old male with a history of aspiration pneumonia.  EXAM: PORTABLE CHEST - 1 VIEW  COMPARISON:  10/13/2014  FINDINGS: Persisting right-sided  airspace disease with low lung volumes. Persisting retrocardiac opacity.  Cardiomediastinal silhouette not well evaluated.  Endotracheal tube terminates approximately 1 cm from the carina. This appears to have been advanced from the comparison.  Unchanged right subclavian central catheter which appears to terminate superior vena cava.  Enteric tube projects over the mediastinum, terminating out of the field of view.  IMPRESSION: Low lung volumes with persisting right-sided airspace disease.  Endotracheal tube terminates proximally 1 cm from the carina. This may be withdrawn 4 cm to 5 cm for better position.  These results were called by telephone at the time of interpretation on 10/14/2014 at 7:02 am to the nurse caring for the patient, Ms. Felicia Flynt who verbally acknowledged these results.  Unchanged right subclavian central line and enteric tube.  Signed,  Yvone Neu. Loreta Ave, DO  [facility]  [facility]   Electronically Signed   By: Gilmer Mor D.O.   On: 10/14/2014 07:04   Dg Chest Port 1 View  10/13/2014   CLINICAL DATA:  Hypoxia  EXAM: PORTABLE CHEST - 1 VIEW  COMPARISON:  Study obtained earlier in the day  FINDINGS: Endotracheal tube tip is 3.3 cm above the carina. Nasogastric tube tip and side port are below the diaphragm. Central catheter tip is in the superior vena cava. No pneumothorax. There is persistent airspace consolidation throughout much of the right lung, stable. There is patchy atelectatic type change in the left base. No new opacity compared to earlier in the day. Heart is upper normal in size with pulmonary vascularity within normal limits. No adenopathy. There is degenerative change in the thoracic spine.  IMPRESSION: Tube and catheter positions as described without pneumothorax. Stable extensive airspace opacification on the right. Patchy atelectasis left base. No new opacity compared to earlier in the day. No change in cardiac silhouette.    Electronically Signed   By: Bretta Bang III M.D.   On: 10/13/2014 12:39   Dg Abd Portable 1v  10/13/2014   CLINICAL DATA:  Orogastric tube placement  EXAM: PORTABLE ABDOMEN - 1 VIEW  COMPARISON:  None.  FINDINGS: Orogastric tube tip and side port in stomach. There are loops of mildly dilated bowel in the mid abdomen. No free air seen on this semi-erect portable study.  IMPRESSION: Orogastric tube tip and side port in stomach. Bowel gas pattern raises question of a degree of ileus or obstruction.   Electronically Signed   By: Bretta BangWilliam  Woodruff III M.D.   On: 10/13/2014 12:40   Dg Fluoro Guide Lumbar Puncture  10/14/2014   CLINICAL DATA:  Fever and altered mental status  EXAM: DIAGNOSTIC LUMBAR PUNCTURE UNDER FLUOROSCOPIC GUIDANCE  FLUOROSCOPY TIME:  Radiation Exposure Index (as provided by the fluoroscopic device): 18.10 mGy entrance does  If the device does not provide the exposure index:  Fluoroscopy Time (in minutes and seconds):  0.8 minutes  Number of Acquired Images:  2  PROCEDURE: Informed consent was obtained from the patient's family the day before the procedure. With the patient prone, the lower back was prepped with Betadine. 1% Lidocaine was used for local anesthesia. Lumbar puncture was performed at aL4 laminectomy defect level using a 5 inch 20 gauge needle with return of clear CSF. Pressures could not be reliably obtained due to patient intubation status and prone positioning. 9 ml of CSF were obtained for laboratory studies. The patient tolerated the procedure well and there were no apparent complications.  IMPRESSION: Successful lumbar puncture with fluoroscopy.   Electronically Signed   By: Marnee SpringJonathon  Watts M.D.   On: 10/14/2014 14:11    Medications:  I have reviewed the patient's current medications. Scheduled: . antiseptic oral rinse  7 mL Mouth Rinse QID  . arformoterol  15 mcg Nebulization BID  . aspirin EC  81 mg Oral Daily  . budesonide  0.25 mg Nebulization BID  . ceFEPime  (MAXIPIME) IV  2 g Intravenous Q12H  . chlorhexidine  15 mL Mouth Rinse BID  . feeding supplement (PRO-STAT SUGAR FREE 64)  60 mL Per Tube TID  . feeding supplement (VITAL HIGH PROTEIN)  1,000 mL Per Tube Q24H  . free water  200 mL Per Tube Q6H  . heparin  5,000 Units Subcutaneous 3 times per day  . insulin aspart  0-20 Units Subcutaneous 6 times per day  . lactulose  30 g Per Tube Daily  . pantoprazole sodium  40 mg Per Tube Q24H  . potassium chloride  40 mEq Per Tube Q4H  . vancomycin  1,500 mg Intravenous Q24H    Assessment/Plan: Patient continues to be intubated and sedated.  LP performed and shows 1wbc, 251rbc, protein of 133 and glucose of 83.  Gram stain was unremarkable.  HSV pending.  CNS infection unlikely.  EEG only significant for slowing.  Do not suspect subclinical seizure activity. MRI showed no evidence of acute changes.   Some metabolic issues continue.  Encephalopathy likely infection/metabolic mediated.    Recommendations: 1.  Agree with current management   LOS: 5 days   Thana FarrLeslie Shuayb Schepers, MD [facility] [phone] 10/15/2014  8:33 AM

## 2014-10-16 ENCOUNTER — Inpatient Hospital Stay (HOSPITAL_COMMUNITY): Payer: Medicare Other

## 2014-10-16 ENCOUNTER — Encounter (HOSPITAL_COMMUNITY): Payer: Self-pay | Admitting: *Deleted

## 2014-10-16 LAB — CBC
HCT: 30.6 % — ABNORMAL LOW (ref 39.0–52.0)
Hemoglobin: 9.6 g/dL — ABNORMAL LOW (ref 13.0–17.0)
MCH: 27.9 pg (ref 26.0–34.0)
MCHC: 31.4 g/dL (ref 30.0–36.0)
MCV: 89 fL (ref 78.0–100.0)
PLATELETS: 90 10*3/uL — AB (ref 150–400)
RBC: 3.44 MIL/uL — ABNORMAL LOW (ref 4.22–5.81)
RDW: 17.6 % — ABNORMAL HIGH (ref 11.5–15.5)
WBC: 6.6 10*3/uL (ref 4.0–10.5)

## 2014-10-16 LAB — COMPREHENSIVE METABOLIC PANEL
ALBUMIN: 1.7 g/dL — AB (ref 3.5–5.0)
ALT: 37 U/L (ref 17–63)
AST: 46 U/L — ABNORMAL HIGH (ref 15–41)
Alkaline Phosphatase: 133 U/L — ABNORMAL HIGH (ref 38–126)
Anion gap: 6 (ref 5–15)
BILIRUBIN TOTAL: 0.9 mg/dL (ref 0.3–1.2)
BUN: 36 mg/dL — ABNORMAL HIGH (ref 6–20)
CO2: 21 mmol/L — ABNORMAL LOW (ref 22–32)
CREATININE: 1.1 mg/dL (ref 0.61–1.24)
Calcium: 8.4 mg/dL — ABNORMAL LOW (ref 8.9–10.3)
Chloride: 123 mmol/L — ABNORMAL HIGH (ref 101–111)
GFR calc non Af Amer: >60 mL/min (ref >60)
GLUCOSE: 195 mg/dL — AB (ref 70–99)
Potassium: 3.9 mmol/L (ref 3.5–5.1)
Sodium: 150 mmol/L — ABNORMAL HIGH (ref 135–145)
Total Protein: 5 g/dL — ABNORMAL LOW (ref 6.5–8.1)

## 2014-10-16 LAB — MAGNESIUM: Magnesium: 2 mg/dL (ref 1.7–2.4)

## 2014-10-16 LAB — GLUCOSE, CAPILLARY
GLUCOSE-CAPILLARY: 197 mg/dL — AB (ref 70–99)
Glucose-Capillary: 127 mg/dL — ABNORMAL HIGH (ref 70–99)
Glucose-Capillary: 142 mg/dL — ABNORMAL HIGH (ref 70–99)
Glucose-Capillary: 155 mg/dL — ABNORMAL HIGH (ref 70–99)
Glucose-Capillary: 175 mg/dL — ABNORMAL HIGH (ref 70–99)
Glucose-Capillary: 202 mg/dL — ABNORMAL HIGH (ref 70–99)

## 2014-10-16 MED ORDER — LACTULOSE 10 GM/15ML PO SOLN
30.0000 g | Freq: Every day | ORAL | Status: DC
  Filled 2014-10-16 (×2): qty 45

## 2014-10-16 MED ORDER — PANTOPRAZOLE SODIUM 40 MG IV SOLR
40.0000 mg | INTRAVENOUS | Status: DC
  Administered 2014-10-16 – 2014-10-17 (×2): 40 mg via INTRAVENOUS
  Filled 2014-10-16 (×3): qty 40

## 2014-10-16 NOTE — Progress Notes (Signed)
PULMONARY / CRITICAL CARE MEDICINE   Name: Cody Schmitt MRN: [id number] DOB: 12/11/1934    ADMISSION DATE:  10/10/2014  REFERRING MD :  [facility] EDP  CHIEF COMPLAINT:  AMS  INITIAL PRESENTATION:  80 year old [facility] with AMS and fever. No identifiable source for infection and [facility].  STUDIES:  CT head 5/2 > no acute intracranial issues RUQ u/s 5/03 > cholelithiasis, diffuse coarsening of hepatic parenchymal structure Echo 5/04 > EF 55 to 60%, PAS 39 mmHg EEG 5/05 >> generalized slowing MRI brain 5/05 >> normal LP 5/06 >> glucose 83, protein 133, RBC 251, WBC 1 Doppler left leg 5/06 >> no DVT  SIGNIFICANT EVENTS: 5/2 [facility] Neuro consulted 5/5 intubated 5/6 off pressors  SUBJECTIVE:  Tolerating pressure support.  Mental status better.  VITAL SIGNS: Temp:  [97.6 F (36.4 C)-99.2 F (37.3 C)] 98.3 F (36.8 C) (05/08 0823) Pulse Rate:  [55-88] 58 (05/08 0903) Resp:  [12-22] 16 (05/08 0800) BP: (97-146)/(43-68) 117/57 mmHg (05/08 0903) SpO2:  [97 %-100 %] 97 % (05/08 0800) FiO2 (%):  [40 %] 40 % (05/08 0903) Weight:  [284 lb 9.8 oz (129.1 kg)] 284 lb 9.8 oz (129.1 kg) (05/08 0345) VENT SETTINGS: Vent Mode:  [-] PSV;CPAP FiO2 (%):  [40 %] 40 % Set Rate:  [16 bmp] 16 bmp Vt Set:  [590 mL] 590 mL PEEP:  [5 cmH20] 5 cmH20 Plateau Pressure:  [14 cmH20-21 cmH20] 14 cmH20 INTAKE / OUTPUT:  Intake/Output Summary (Last 24 hours) at 10/16/14 1216 Last data filed at 10/16/14 0800  Gross per 24 hour  Intake 3154.33 ml  Output   1580 ml  Net 1574.33 ml    PHYSICAL EXAMINATION: General: no distress Neuro:  RASS +1 HEENT: no sinus tenderness Cardiovascular: regular Lungs: no wheeze Abdomen:  Soft, non tender Musculoskeletal: 1+ lower edema Skin: no rashes  LABS:  CBC  Recent Labs Lab 10/14/14 0622 10/15/14 0311 10/16/14 0348  WBC 4.8 4.6 6.6  HGB 9.2* 9.1* 9.6*  HCT 29.1*  29.6* 30.6*  PLT 101* 91* 90*   Coag's  Recent Labs Lab 10/10/14 1930  INR 1.44   BMET  Recent Labs Lab 10/14/14 0622 10/15/14 0311 10/16/14 0348  NA 151* 147* 150*  K 3.5 3.1* 3.9  CL 124* 122* 123*  CO2 20* 21* 21*  BUN 34* 33* 36*  CREATININE 1.20 1.07 1.10  GLUCOSE 170* 159* 195*   Electrolytes  Recent Labs Lab 10/13/14 1201 10/14/14 0110 10/14/14 0622 10/15/14 0311 10/16/14 0348  CALCIUM  --   --  8.6* 8.2* 8.4*  MG 2.0 2.1  --  2.1 2.0  PHOS 3.5 3.3  --  3.6  --      Sepsis Markers  Recent Labs Lab 10/10/14 1930 10/11/14 0415 10/12/14 0515  LATICACIDVEN 1.5  --   --   PROCALCITON 1.92  1.97 1.67 1.64   Liver Enzymes  Recent Labs Lab 10/12/14 0515 10/14/14 0622 10/16/14 0348  AST 44* 44* 46*  ALT 36 35 37  ALKPHOS 93 95 133*  BILITOT 1.6* 1.1 0.9  ALBUMIN 2.1* 1.9* 1.7*   Cardiac Enzymes  Recent Labs Lab 10/10/14 1930 10/10/14 2215 10/11/14 0415  TROPONINI 0.13* 0.13* 0.17*   Glucose  Recent Labs Lab 10/15/14 1241 10/15/14 1618 10/15/14 1943 10/16/14 10/16/14 0344 10/16/14 0828  GLUCAP 171* 138* 171* 155* 175* 202*    Imaging Dg Chest Port 1 [address]  10/15/2014   CLINICAL DATA:  Aspiration pneumonia  EXAM: PORTABLE CHEST - 1 VIEW  COMPARISON:  10/14/2014  FINDINGS: Endotracheal tube tip is 7.9 cm above the carina. Nasogastric tube extends below the diaphragm and off the inferior edge of the image. There is a right subclavian central line with tip in the SVC. Right upper lobe consolidation is slightly improved, now with less densely confluent consolidation. There is unchanged mild airspace opacity in both bases. No large effusions are evident.  IMPRESSION: Partial clearance of right upper lobe consolidation. Support equipment appears satisfactorily positioned.   Electronically Signed   By: Ellery Plunkaniel R Mitchell M.D.   On: 10/15/2014 07:10    Lab Results  Component Value Date   TSH 1.156 10/10/2014    ASSESSMENT /  PLAN:  PULMONARY A: Acute respiratory failure 2nd to aspiration. Hx of asthma. P:   Proceed with extubation F/u CXR Continue brovana, pulmicort and prn albuterol  CARDIOVASCULAR Rt [place] CVL 5/02 >>  A:  Septic shock >> off pressors 5/05. Hx of HTN. NSTEMI. P:  Continue ASA Holding lasix, KCl, HCTZ, losartan, metoprolol, red yeast rice  RENAL A:   CKD stage 3. Anion gap metabolic acidosis >> no osmolar gap >> resolved. Hyperchloremic non gap acidosis. Hypernatremia. Hypokalemia. P:   Changed IV fluids to D5W 5/06 >> increase to 50 ml/hr on 5/08 Monitor renal fx urine outpt  GASTROINTESTINAL A:   Abdominal tenderness >> resolved. Hx of Hep C. Abnormal liver contour on abd u/s ?cirrhosis. P:   Swallow evaluation after extubation, then advance diet Protonix for SUP  HEMATOLOGIC A:   Anemia, thrombocytopenia of critical illness. P:  F/u CBC SQ heparin for DVT prevention  INFECTIOUS A:   ?sepsis >> no obvious source; doubt meningitis/encephalitis. Diarrhea >> C diff PCR negative 5/06. Aspiration pneumonia 5/05. P:   Day 7 of Abx, currently on vancomycin, cefepime  Blood (OSH) 5/02 >> Urine (OSH) 5/02 >> negative CSF 5/06 >>  ENDOCRINE A:   DM. P:   SSI Holding home onglyza  NEUROLOGIC A:   Acute encephalopathy >> cause uncertain; if from cirrhosis and sepsis >> improving. Elevated ammonia. P:   Wean off precedex for RASS goal 0 Added lactulose 5/03  D/w pts wife at bedside.  CC time 35 minutes.  Coralyn HellingVineet Jayli Fogleman, MD [facility] Pulmonary/Critical Care 10/16/2014, 12:16 PM Pager:  [phone] After 3pm call: [phone]

## 2014-10-16 NOTE — Procedures (Signed)
Extubation Procedure Note  Patient Details:   Name: Cody Schmitt DOB: 04/04/1935 MRN: [id number]   Airway Documentation:  Airway 8 mm (Active)  Secured at (cm) 23 cm 10/16/2014  9:03 AM  Measured From Lips 10/16/2014  9:03 AM  Secured Location Left 10/16/2014  9:03 AM  Secured By [employer] Tube Holder 10/16/2014  9:03 AM  Tube Holder Repositioned Yes 10/16/2014  9:03 AM  Cuff Pressure (cm H2O) 24 cm H2O 10/16/2014  9:03 AM  Site Condition Dry 10/16/2014  9:03 AM  Extubated to 4lpm [place], no distress at this time.  Evaluation  O2 sats: stable throughout Complications: No apparent complications Patient did tolerate procedure well. Bilateral Breath Sounds: Clear Suctioning: Airway Yes  Renae FickleScott, Yazmina Pareja Michelle 10/16/2014, 12:50 PM

## 2014-10-16 NOTE — Progress Notes (Signed)
Subjective: Patient remains intubated and on Precedex with prn Fentanyl.    Objective: Current vital signs: BP 117/57 mmHg  Pulse 58  Temp(Src) 98.3 F (36.8 C) (Oral)  Resp 16  Ht 5\' 10"  (1.778 m)  Wt 129.1 kg (284 lb 9.8 oz)  BMI 40.84 kg/m2  SpO2 97% Vital signs in last 24 hours: Temp:  [97.6 F (36.4 C)-99.2 F (37.3 C)] 98.3 F (36.8 C) (05/08 0823) Pulse Rate:  [55-88] 58 (05/08 0903) Resp:  [12-22] 16 (05/08 0800) BP: (97-146)/(43-69) 117/57 mmHg (05/08 0903) SpO2:  [97 %-100 %] 97 % (05/08 0800) FiO2 (%):  [40 %] 40 % (05/08 0903) Weight:  [129.1 kg (284 lb 9.8 oz)] 129.1 kg (284 lb 9.8 oz) (05/08 0345)  Intake/Output from previous day: 05/07 0701 - 05/08 0700 In: 3639.7 [I.V.:1409.7; NG/GT:1630; IV Piggyback:600] Out: 1815 [Urine:1665; Stool:150] Intake/Output this shift: Total I/O In: 131.6 [I.V.:61.6; NG/GT:70] Out: 130 [Urine:130] Nutritional status: Diet NPO time specified  Neurologic Exam: Responds to verbal stimuli and only light stimulation.  Restrained. Struggles to open his eyes. Does not follow commands. No attempts made at verbalization.  Cranial Nerves: II: patient does not respond confrontation bilaterally, pupils right 3 mm, left 3 mm,and reactive bilaterally III,IV,VI: doll's response present bilaterally.  V,VII: corneal reflex present bilaterally  VIII: patient does not respond to verbal stimuli IX,X: gag reflex reduced, XI: trapezius strength unable to test bilaterally XII: tongue strength unable to test Motor: Moves all extremities with stimulation Sensory: Does not respond to noxious stimuli in any extremity.   Lab Results: Basic Metabolic Panel:  Recent Labs Lab 10/10/14 1930  10/12/14 0515 10/13/14 0225 10/13/14 1201 10/14/14 0110 10/14/14 0622 10/15/14 0311 10/16/14 0348  NA 136  < > 141 145  --   --  151* 147* 150*  K 3.8  < > 4.1 3.7  --   --  3.5 3.1* 3.9  CL 106  < > 111 117*  --   --  124* 122* 123*  CO2 25  <  > 22 19*  --   --  20* 21* 21*  GLUCOSE 143*  < > 174* 148*  --   --  170* 159* 195*  BUN 24*  < > 27* 28*  --   --  34* 33* 36*  CREATININE 1.53*  < > 1.40* 1.23  --   --  1.20 1.07 1.10  CALCIUM 8.1*  < > 8.3* 8.3*  --   --  8.6* 8.2* 8.4*  MG 1.6*  --   --   --  2.0 2.1  --  2.1 2.0  PHOS 3.3  --   --   --  3.5 3.3  --  3.6  --   < > = values in this interval not displayed.  Liver Function Tests:  Recent Labs Lab 10/10/14 1930 10/12/14 0515 10/14/14 0622 10/16/14 0348  AST 62* 44* 44* 46*  ALT 40 36 35 37  ALKPHOS 114 93 95 133*  BILITOT 1.5* 1.6* 1.1 0.9  PROT 5.7* 5.5* 5.1* 5.0*  ALBUMIN 2.3* 2.1* 1.9* 1.7*    Recent Labs Lab 10/10/14 1930  LIPASE 21*  AMYLASE 28    Recent Labs Lab 10/10/14 1930 10/11/14 1152  AMMONIA 50* 15    CBC:  Recent Labs Lab 10/10/14 1930  10/12/14 0515 10/13/14 0225 10/14/14 0622 10/15/14 0311 10/16/14 0348  WBC 11.3*  < > 13.2* 9.6 4.8 4.6 6.6  NEUTROABS 8.8*  --   --   --   --   --   --  HGB 9.1*  < > 10.1* 9.9* 9.2* 9.1* 9.6*  HCT 27.7*  < > 32.0* 31.0* 29.1* 29.6* 30.6*  MCV 85.0  < > 86.5 85.9 87.1 87.8 89.0  PLT 84*  < > 105* 118* 101* 91* 90*  < > = values in this interval not displayed.  Cardiac Enzymes:  Recent Labs Lab 10/10/14 1930 10/10/14 2215 10/11/14 0415  TROPONINI 0.13* 0.13* 0.17*    Lipid Panel: No results for input(s): CHOL, TRIG, HDL, CHOLHDL, VLDL, LDLCALC in the last 168 hours.  CBG:  Recent Labs Lab 10/15/14 1618 10/15/14 1943 10/16/14 10/16/14 0344 10/16/14 0828  GLUCAP 138* 171* 155* 175* 202*    Microbiology: Results for orders placed or performed during the hospital encounter of 10/10/14  MRSA PCR Screening     Status: None   Collection Time: 10/10/14  3:40 PM  Result Value Ref Range Status   MRSA by PCR NEGATIVE NEGATIVE Final    Comment:        The GeneXpert MRSA Assay (FDA approved for NASAL specimens only), is one component of a comprehensive MRSA  colonization surveillance program. It is not intended to diagnose MRSA infection nor to guide or monitor treatment for MRSA infections.   Culture, respiratory (NON-Expectorated)     Status: None   Collection Time: 10/13/14  4:00 PM  Result Value Ref Range Status   Specimen Description ENDOTRACHEAL  Final   Special Requests NONE  Final   Gram Stain   Final    RARE WBC PRESENT, PREDOMINANTLY MONONUCLEAR RARE SQUAMOUS EPITHELIAL CELLS PRESENT NO ORGANISMS SEEN Performed at [facility]    Culture   Final    RARE CANDIDA ALBICANS Performed at [facility]    Report Status 10/15/2014 FINAL  Final  Clostridium Difficile by PCR     Status: None   Collection Time: 10/14/14  4:30 AM  Result Value Ref Range Status   C difficile by pcr NEGATIVE NEGATIVE Final  Gram stain     Status: None   Collection Time: 10/14/14  1:42 PM  Result Value Ref Range Status   Specimen Description CSF  Final   Special Requests NO2  Final   Gram Stain   Final    CYTOSPIN PREP WBC PRESENT, PREDOMINANTLY MONONUCLEAR NO ORGANISMS SEEN    Report Status 10/14/2014 FINAL  Final  CSF culture     Status: None (Preliminary result)   Collection Time: 10/14/14  1:42 PM  Result Value Ref Range Status   Specimen Description FLUID CSF  Final   Special Requests NO2  Final   Gram Stain   Final    NO WBC SEEN NO ORGANISMS SEEN CYTOSPIN Performed at [facility]    Culture NO GROWTH Performed at [facility]   Final   Report Status PENDING  Incomplete    Coagulation Studies: No results for input(s): LABPROT, INR in the last 72 hours.  Imaging: Dg Chest Port 1 View  10/16/2014   CLINICAL DATA:  Followup aspiration pneumonia.  Intubated.  EXAM: PORTABLE CHEST - 1 VIEW  COMPARISON:  Yesterday.  FINDINGS: Endotracheal tube in satisfactory position. Nasogastric tube extending into the stomach. Right subclavian catheter tip in the superior vena cava. Stable enlarged cardiac  silhouette and mildly prominent pulmonary vasculature. Decreased bilateral airspace opacity. No definite pleural fluid. Thoracic spine degenerative changes.  IMPRESSION: 1. Improving bilateral alveolar edema or pneumonia. 2. Stable cardiomegaly.   Electronically Signed   By: Zada FindersSteven  Reid M.D.  On: 10/16/2014 08:28   Dg Chest Port 1 View  10/15/2014   CLINICAL DATA:  Aspiration pneumonia  EXAM: PORTABLE CHEST - 1 VIEW  COMPARISON:  10/14/2014  FINDINGS: Endotracheal tube tip is 7.9 cm above the carina. Nasogastric tube extends below the diaphragm and off the inferior edge of the image. There is a right subclavian central line with tip in the SVC. Right upper lobe consolidation is slightly improved, now with less densely confluent consolidation. There is unchanged mild airspace opacity in both bases. No large effusions are evident.  IMPRESSION: Partial clearance of right upper lobe consolidation. Support equipment appears satisfactorily positioned.   Electronically Signed   By: Ellery Plunk M.D.   On: 10/15/2014 07:10   Dg Fluoro Guide Lumbar Puncture  10/14/2014   CLINICAL DATA:  Fever and altered mental status  EXAM: DIAGNOSTIC LUMBAR PUNCTURE UNDER FLUOROSCOPIC GUIDANCE  FLUOROSCOPY TIME:  Radiation Exposure Index (as provided by the fluoroscopic device): 18.10 mGy entrance does  If the device does not provide the exposure index:  Fluoroscopy Time (in minutes and seconds):  0.8 minutes  Number of Acquired Images:  2  PROCEDURE: Informed consent was obtained from the patient's family the day before the procedure. With the patient prone, the lower back was prepped with Betadine. 1% Lidocaine was used for local anesthesia. Lumbar puncture was performed at aL4 laminectomy defect level using a 5 inch 20 gauge needle with return of clear CSF. Pressures could not be reliably obtained due to patient intubation status and prone positioning. 9 ml of CSF were obtained for laboratory studies. The patient tolerated  the procedure well and there were no apparent complications.  IMPRESSION: Successful lumbar puncture with fluoroscopy.   Electronically Signed   By: Marnee Spring M.D.   On: 10/14/2014 14:11    Medications:  I have reviewed the patient's current medications. Scheduled: . antiseptic oral rinse  7 mL Mouth Rinse QID  . arformoterol  15 mcg Nebulization BID  . aspirin EC  81 mg Oral Daily  . budesonide  0.25 mg Nebulization BID  . ceFEPime (MAXIPIME) IV  2 g Intravenous Q12H  . chlorhexidine  15 mL Mouth Rinse BID  . feeding supplement (PRO-STAT SUGAR FREE 64)  60 mL Per Tube TID  . feeding supplement (VITAL HIGH PROTEIN)  1,000 mL Per Tube Q24H  . free water  200 mL Per Tube Q6H  . heparin  5,000 Units Subcutaneous 3 times per day  . insulin aspart  0-20 Units Subcutaneous 6 times per day  . lactulose  30 g Per Tube Daily  . pantoprazole sodium  40 mg Per Tube Q24H  . vancomycin  1,500 mg Intravenous Q24H    Assessment/Plan: 80 year old with altered mental status.  MRI and LP unremarkable.  HSV negative. EEG only significant for slowing.  No significant improvement in mental status since admission.  WBC count improved but hypernatremic, renal insufficiency noted and some evidence of hepatic impairment.  Encephalopathy likely metabolic mediated.  Recommendations: 1. Agree with current management   LOS: 6 days   Thana Farr, MD [facility] [phone] 10/16/2014  10:13 AM

## 2014-10-16 NOTE — Progress Notes (Signed)
SLP Cancellation Note  Patient Details Name: Cody Schmitt MRN: [id number] DOB: 02/26/1935   Cancelled treatment:       Reason Eval/Treat Not Completed:  Pt extubated about 2 hours previous.  Will hold BSE for today and complete tomorrow.  Nurse aware.     Fleet ContrasGoodman, Kemani Demarais N 10/16/2014, 2:34 PM  Dimas AguasMelissa Raun Routh, MA, CCC-SLP Acute Rehab SLP [phone]

## 2014-10-17 ENCOUNTER — Inpatient Hospital Stay (HOSPITAL_COMMUNITY): Payer: Medicare Other

## 2014-10-17 DIAGNOSIS — R6520 Severe sepsis without septic shock: Secondary | ICD-10-CM

## 2014-10-17 DIAGNOSIS — A419 Sepsis, unspecified organism: Secondary | ICD-10-CM | POA: Diagnosis present

## 2014-10-17 LAB — COMPREHENSIVE METABOLIC PANEL
ALK PHOS: 116 U/L (ref 38–126)
ALT: 38 U/L (ref 17–63)
ANION GAP: 6 (ref 5–15)
AST: 51 U/L — ABNORMAL HIGH (ref 15–41)
Albumin: 1.7 g/dL — ABNORMAL LOW (ref 3.5–5.0)
BILIRUBIN TOTAL: 1.1 mg/dL (ref 0.3–1.2)
BUN: 33 mg/dL — AB (ref 6–20)
CO2: 22 mmol/L (ref 22–32)
Calcium: 8.5 mg/dL — ABNORMAL LOW (ref 8.9–10.3)
Chloride: 122 mmol/L — ABNORMAL HIGH (ref 101–111)
Creatinine, Ser: 1.21 mg/dL (ref 0.61–1.24)
GFR calc Af Amer: >60 mL/min (ref >60)
GFR, EST NON AFRICAN AMERICAN: 56 mL/min — AB (ref >60)
GLUCOSE: 135 mg/dL — AB (ref 70–99)
POTASSIUM: 3.8 mmol/L (ref 3.5–5.1)
SODIUM: 150 mmol/L — AB (ref 135–145)
TOTAL PROTEIN: 5.2 g/dL — AB (ref 6.5–8.1)

## 2014-10-17 LAB — CBC
HCT: 30.2 % — ABNORMAL LOW (ref 39.0–52.0)
HEMOGLOBIN: 9.6 g/dL — AB (ref 13.0–17.0)
MCH: 28.1 pg (ref 26.0–34.0)
MCHC: 31.8 g/dL (ref 30.0–36.0)
MCV: 88.3 fL (ref 78.0–100.0)
PLATELETS: 89 10*3/uL — AB (ref 150–400)
RBC: 3.42 MIL/uL — AB (ref 4.22–5.81)
RDW: 18.2 % — ABNORMAL HIGH (ref 11.5–15.5)
WBC: 7 10*3/uL (ref 4.0–10.5)

## 2014-10-17 LAB — GLUCOSE, CAPILLARY
GLUCOSE-CAPILLARY: 136 mg/dL — AB (ref 70–99)
GLUCOSE-CAPILLARY: 122 mg/dL — AB (ref 70–99)
Glucose-Capillary: 134 mg/dL — ABNORMAL HIGH (ref 70–99)
Glucose-Capillary: 140 mg/dL — ABNORMAL HIGH (ref 70–99)
Glucose-Capillary: 141 mg/dL — ABNORMAL HIGH (ref 70–99)
Glucose-Capillary: 153 mg/dL — ABNORMAL HIGH (ref 70–99)

## 2014-10-17 MED ORDER — HALOPERIDOL LACTATE 5 MG/ML IJ SOLN
5.0000 mg | Freq: Once | INTRAMUSCULAR | Status: AC
  Administered 2014-10-17: 5 mg via INTRAMUSCULAR
  Filled 2014-10-17: qty 1

## 2014-10-17 NOTE — Plan of Care (Signed)
Problem: SLP Dysphagia Goals Goal: Patient will demonstrate readiness for PO's Patient will demonstrate readiness for PO's and/or instrumental swallow study as evidenced by: Ability to consume ice chip trials with minimal s/s of aspiration with mod assist

## 2014-10-17 NOTE — Evaluation (Signed)
Clinical/Bedside Swallow Evaluation Patient Details  Name: Cody Schmitt MRN: [id number] Date of Birth: 10/28/1934  Today's Date: 10/17/2014 Time: SLP Start Time (ACUTE ONLY): 0914 SLP Stop Time (ACUTE ONLY): 0921 SLP Time Calculation (min) (ACUTE ONLY): 7 min  Past Medical History:  Past Medical History  Diagnosis Date  . HTN (hypertension)   . Asthma   . Hepatitis C   . Dyslipidemia   . GERD (gastroesophageal reflux disease)   . Diabetes mellitus without complication    Past Surgical History:  Past Surgical History  Procedure Laterality Date  . Colonoscopy    . Cataract surgery     HPI:  80 [facility]MS and fever. No identifiable source for infection and [facility] Intubated 5/5-5/8. Diagnosed with acute respiratory failure secondary to aspiration. Required NT suctioning with removal of thick secretions from back of throat prior to intubation. PMH of GERD, diabetes, HTN, asthma.    Assessment / Plan / Recommendation Clinical Impression  Patient presents with an acute reversible dysphagia s/p recent intubation characterized by evidence of decreased glottal closure (dysphonia, rough cough) resulting in evidence of decreased airway protection with ice chip trials. Aspiration risk high. Prognosis for improved swallow response and ability to advance diet good with time off vent. Recommend NPO except conservative ice chips after oral care. RN aware. SLP will f/u in am 5/10.     Aspiration Risk  Severe    Diet Recommendation NPO;Ice chips PRN after oral care   Medication Administration: Via alternative means    Other  Recommendations Oral Care Recommendations: Oral care QID (prior to ice chips)      Frequency and Duration    2 weeks   Pertinent Vitals/Pain N/a        Swallow Study    General Other Pertinent Information: 80 year old male [facility] and fever. No identifiable  source for infection and [facility]. Intubated 5/5-5/8. Diagnosed with acute respiratory failure secondary to aspiration. Required NT suctioning with removal of thick secretions from back of throat prior to intubation. PMH of GERD, diabetes, HTN, asthma.  Type of Study: Bedside swallow evaluation Previous Swallow Assessment: none Diet Prior to this Study: NPO Temperature Spikes Noted: No Respiratory Status:  (nasal cannula) History of Recent Intubation: Yes Length of Intubations (days): 3 days Date extubated: 10/16/14 Behavior/Cognition: Alert;Cooperative;Pleasant mood;Confused Oral Cavity - Dentition: Adequate natural dentition/normal for age (partials) Self-Feeding Abilities: Needs assist Patient Positioning: Upright in bed Baseline Vocal Quality: Hoarse;Low vocal intensity;Breathy Volitional Cough: Strong Volitional Swallow: Able to elicit    Oral/Motor/Sensory Function Overall Oral Motor/Sensory Function: Appears within functional limits for tasks assessed   Ice Chips Ice chips: Impaired Presentation: Spoon Pharyngeal Phase Impairments: Wet Vocal Quality;Throat Clearing - Immediate;Throat Clearing - Delayed;Cough - Immediate;Cough - Delayed (wet vocal quality, wet upper airway noise)   Thin Liquid Thin Liquid: Not tested    Nectar Thick Nectar Thick Liquid: Not tested   Honey Thick Honey Thick Liquid: Not tested   Puree Puree: Not tested   Solid   GO   Gustavo Dispenza MA, CCC-SLP [phone]  Solid: Not tested       Cody Schmitt 10/17/2014,9:28 AM

## 2014-10-17 NOTE — Progress Notes (Signed)
PULMONARY / CRITICAL CARE MEDICINE   Name: Cody RelicJohn W Schmitt MRN: [id number] DOB: 03/29/1935    ADMISSION DATE:  10/10/2014  REFERRING MD :  [facility] ED[facility]  CHIEF COMPLAINT:  AMS  INITIAL PRESENTATION:  80 year old [facility] with AMS and fever. No identifiable source for infection and [facility].  STUDIES:  CT head 5/2 > no acute intracranial issues RUQ u/s 5/03 > cholelithiasis, diffuse coarsening of hepatic parenchymal structure Echo 5/04 > EF 55 to 60%, PAS 39 mmHg EEG 5/05 >> generalized slowing MRI brain 5/05 >> normal LP 5/06 >> glucose 83, protein 133, RBC 251, WBC 1 Doppler left leg 5/06 >> no DVT  SIGNIFICANT EVENTS: [facility] Neuro consulted 5/5 intubated 5/6 off pressors 5/08 extubated 5/09 Very agitated with continuous Precedex reqt. Failed SLP swallow eval. NPO recommended  SUBJECTIVE:  Has tolerated extubation. Remains extremely agitated when dexmedetomidine infusion is decreased  VITAL SIGNS: Temp:  [97.6 F (36.4 C)-99.2 F (37.3 C)] 98.3 F (36.8 C) (05/09 1651) Pulse Rate:  [58-87] 72 (05/09 1700) Resp:  [16-27] 27 (05/09 1700) BP: (105-166)/(41-131) 113/47 mmHg (05/09 1700) SpO2:  [93 %-99 %] 96 % (05/09 1700) Weight:  [131.7 kg (290 lb 5.5 oz)] 131.7 kg (290 lb 5.5 oz) (05/09 0500) VENT SETTINGS:   INTAKE / OUTPUT:  Intake/Output Summary (Last 24 hours) at 10/17/14 1841 Last data filed at 10/17/14 1700  Gross per 24 hour  Intake 2279.68 ml  Output   2005 ml  Net 274.68 ml    PHYSICAL EXAMINATION: General: no distress Neuro:  RASS 0 while on dex. No focal deficits HEENT: WNL Cardiovascular: regular, No M Lungs: Clear Abdomen:  Soft, non tender Musculoskeletal: 2-3+ ankle and pedal edema Skin: no rashes  LABS:  CBC  Recent Labs Lab 10/15/14 0311 10/16/14 0348 10/17/14 0556  WBC 4.6 6.6 7.0  HGB 9.1* 9.6* 9.6*  HCT 29.6* 30.6* 30.2*  PLT 91* 90* 89*    Coag's  Recent Labs Lab 10/10/14 1930  INR 1.44   BMET  Recent Labs Lab 10/15/14 0311 10/16/14 0348 10/17/14 0556  NA 147* 150* 150*  K 3.1* 3.9 3.8  CL 122* 123* 122*  CO2 21* 21* 22  BUN 33* 36* 33*  CREATININE 1.07 1.10 1.21  GLUCOSE 159* 195* 135*   Electrolytes  Recent Labs Lab 10/13/14 1201 10/14/14 0110  10/15/14 0311 10/16/14 0348 10/17/14 0556  CALCIUM  --   --   < > 8.2* 8.4* 8.5*  MG 2.0 2.1  --  2.1 2.0  --   PHOS 3.5 3.3  --  3.6  --   --   < > = values in this interval not displayed.   Sepsis Markers  Recent Labs Lab 10/10/14 1930 10/11/14 0415 10/12/14 0515  LATICACIDVEN 1.5  --   --   PROCALCITON 1.92  1.97 1.67 1.64   Liver Enzymes  Recent Labs Lab 10/14/14 0622 10/16/14 0348 10/17/14 0556  AST 44* 46* 51*  ALT 35 37 38  ALKPHOS 95 133* 116  BILITOT 1.1 0.9 1.1  ALBUMIN 1.9* 1.7* 1.7*   Cardiac Enzymes  Recent Labs Lab 10/10/14 1930 10/10/14 2215 10/11/14 0415  TROPONINI 0.13* 0.13* 0.17*   Glucose  Recent Labs Lab 10/16/14 2014 10/17/14 0005 10/17/14 0356 10/17/14 0815 10/17/14 1129 10/17/14 1655  GLUCAP 127* 140* 141* 153* 134* 136*    Imaging Dg Chest Port 1 View  10/16/2014  CLINICAL DATA:  Followup aspiration pneumonia.  Intubated.  EXAM: PORTABLE CHEST - 1 VIEW  COMPARISON:  Yesterday.  FINDINGS: Endotracheal tube in satisfactory position. Nasogastric tube extending into the stomach. Right subclavian catheter tip in the superior vena cava. Stable enlarged cardiac silhouette and mildly prominent pulmonary vasculature. Decreased bilateral airspace opacity. No definite pleural fluid. Thoracic spine degenerative changes.  IMPRESSION: 1. Improving bilateral alveolar edema or pneumonia. 2. Stable cardiomegaly.   Electronically Signed   By: Beckie SaltsSteven  Reid M.D.   On: 10/16/2014 08:28    Lab Results  Component Value Date   TSH 1.156 10/10/2014    ASSESSMENT / PLAN:  PULMONARY A: Acute respiratory  failure RLL infiltrate - suspected aspiration P:   Supp O2 as needed Continue brovana, pulmicort and prn albuterol  CARDIOVASCULAR Rt [place] CVL 5/02 >>  A:  Septic shock, resolved Hx of HTN NSTEMI P:  Continue ASA Holding lasix, KCl, HCTZ, losartan, metoprolol, red yeast rice  RENAL A:   CKD stage 3 Anion gap metabolic acidosis, resolved Hypernatremia Hypokalemia, resolved P:   Monitor BMET intermittently Monitor I/Os Correct electrolytes as indicated Cont D5W  GASTROINTESTINAL A:   Abdominal tenderness, resolved Hx of Hep C Abn liver US, ?cirrhosis P:   SUP: N/I post extubation NPO until cognition improves  HEMATOLOGIC A:   Anemia without acute blood loss Thrombocytopenia, suspect chronic P:  DVT px: SQ heparin Monitor CBC intermittently Transfuse per usual ICU guidelines   INFECTIOUS A:   Severe sepsis Aspiration PNA Diarrhea >> C diff PCR negative 5/06. P:   Vanc 5/02 >> 5/09 Cefepime 5/02 >>   Blood (OSH) 5/02 >> NEG Urine (OSH) 5/02 >> negative CSF 5/06 >> NEG  ENDOCRINE A:   DM 2, adequately controlled P:   Cont SSI Holding home onglyza  NEUROLOGIC A:   Acute encephalopathy, unclear etiology Severe agitation P:   Wean off precedex for RASS goal 0  Billy Fischeravid Simonds, MD ; [facility] service Mobile [phone].  After 5:30 PM or weekends, call [phone]

## 2014-10-17 NOTE — Progress Notes (Signed)
ANTIBIOTIC CONSULT NOTE - FOLLOW UP  Pharmacy Consult:  Vancomycin / Cefepime Indication: rule out sepsis and unknown source  Allergies  Allergen Reactions  . Lisinopril Cough  . Statins Nausea And Vomiting  . Zetia [Ezetimibe] Nausea And Vomiting  . Coumadin [Warfarin Sodium] Rash  . Dilaudid [Hydromorphone Hcl] Itching and Rash    Patient Measurements: Height: 5\' 10"  (177.8 cm) Weight: 290 lb 5.5 oz (131.7 kg) IBW/kg (Calculated) : 73   Vital Signs: Temp: 97.7 F (36.5 C) (05/09 0820) Temp Source: Oral (05/09 0820) BP: 128/41 mmHg (05/09 0600) Pulse Rate: 65 (05/09 0600) Intake/Output from previous day: 05/08 0701 - 05/09 0700 In: 2349.7 [I.V.:1529.7; NG/GT:220; IV Piggyback:600] Out: 2560 [Urine:2160; Stool:400]  Labs:  Recent Labs  10/15/14 0311 10/16/14 0348 10/17/14 0556  WBC 4.6 6.6 7.0  HGB 9.1* 9.6* 9.6*  PLT 91* 90* 89*  CREATININE 1.07 1.10 1.21   Estimated Creatinine Clearance: 69.8 mL/min (by C-G formula based on Cr of 1.21). No results for input(s): VANCOTROUGH, VANCOPEAK, VANCORANDOM, GENTTROUGH, GENTPEAK, GENTRANDOM, TOBRATROUGH, TOBRAPEAK, TOBRARND, AMIKACINPEAK, AMIKACINTROU, AMIKACIN in the last 72 hours.   Microbiology: Recent Results (from the past 720 hour(s))  MRSA PCR Screening     Status: None   Collection Time: 10/10/14  3:40 PM  Result Value Ref Range Status   MRSA by PCR NEGATIVE NEGATIVE Final    Comment:        The GeneXpert MRSA Assay (FDA approved for NASAL specimens only), is one component of a comprehensive MRSA colonization surveillance program. It is not intended to diagnose MRSA infection nor to guide or monitor treatment for MRSA infections.   Culture, respiratory (NON-Expectorated)     Status: None   Collection Time: 10/13/14  4:00 PM  Result Value Ref Range Status   Specimen Description ENDOTRACHEAL  Final   Special Requests NONE  Final   Gram Stain   Final    RARE WBC PRESENT, PREDOMINANTLY MONONUCLEAR RARE  SQUAMOUS EPITHELIAL CELLS PRESENT NO ORGANISMS SEEN Performed at [facility]    Culture   Final    RARE CANDIDA ALBICANS Performed at [facility]    Report Status 10/15/2014 FINAL  Final  Clostridium Difficile by PCR     Status: None   Collection Time: 10/14/14  4:30 AM  Result Value Ref Range Status   C difficile by pcr NEGATIVE NEGATIVE Final  Gram stain     Status: None   Collection Time: 10/14/14  1:42 PM  Result Value Ref Range Status   Specimen Description CSF  Final   Special Requests NO2  Final   Gram Stain   Final    CYTOSPIN PREP WBC PRESENT, PREDOMINANTLY MONONUCLEAR NO ORGANISMS SEEN    Report Status 10/14/2014 FINAL  Final  CSF culture     Status: None (Preliminary result)   Collection Time: 10/14/14  1:42 PM  Result Value Ref Range Status   Specimen Description FLUID CSF  Final   Special Requests NO2  Final   Gram Stain   Final    NO WBC SEEN NO ORGANISMS SEEN CYTOSPIN Performed at [facility]    Culture   Final    NO GROWTH 1 DAY Performed at [facility]    Report Status PENDING  Incomplete      Assessment: 79 YOM continues on cefepime for PNA.  Patient's renal function is fluctuating, currently on the rise.  Urine output remains decent at 0.7 ml/kg/hr.  CXR showed progressive diffuse right  lung infiltrate, persistent left lower lobe infiltrate, and small right pleural effusion.  Vanc 5/2 >> 5/9 Cefepime 5/2 >> Acyclovir 5/2 >> 5/3  5/6 VT = 14 mcg/mL (drawn 1.5hr late) - continue 1.5gm IV q24  5/1 BCx2 ([facility]) - neg and final 5/2 UCx ([facility]) - neg and final 5/6 CSF - ngtd  5/5 endotrach- rare CA 5/6 C.diff - neg MRSA PCR - neg   Goal of Therapy:  Resolution of infection   Plan:  - Continue cefepime 2gm IV Q12H - Monitor renal fxn, clinical progress    Vella Colquitt D. Laney Potashang, PharmD, BCPS Pager:  [phone] 10/17/2014, 11:41 AM

## 2014-10-17 NOTE — Progress Notes (Addendum)
Subjective: Awake and able to follow commands. Difficulty make phonation.    Exam: Filed Vitals:   10/17/14 0820  BP:   Pulse:   Temp: 97.7 F (36.5 C)  Resp:     HEENT-  Normocephalic, no lesions, without obvious abnormality.  Normal external eye and conjunctiva.  Normal TM's bilaterally.  Normal auditory canals and external ears. Normal external nose, mucus membranes and septum.  Normal pharynx. Cardiovascular- S1, S2 normal, pulses palpable throughout   Lungs- no tachypnea, retractions or cyanosis Abdomen- normal findings: bowel sounds normal Extremities- no edema Lymph-no adenopathy palpable Musculoskeletal-no joint tenderness, deformity or swelling Skin-warm and dry, no hyperpigmentation, vitiligo, or suspicious lesions    Gen: In bed, NAD MS: alert and able to follow commands. Not oriented to place or year.  CN: PERRLA, EOMI, TML, Face symmetrical Motor: moving all extremities antigravity Sensory: intact throughout to PP and LT DTR: 1+ in UE with no AJ or KJ  Pertinent Labs: Na+ 150 Cl 122 Bun 33 Albumin 1.7 PLT 89  Cody MornDavid Smith PA-C [facility] [phone]  Impression: 80 YO presenting with AMS. MRI and LP unremarkable. EEG showed only slowing. Patient now awake and able to follow visual commands and most verbal commands. Still with apparent delirium and requiring sedation with precedex. I suspect a multifactorial metabolic encephalopathy.    Recommendations: 1) continue present management   Ritta SlotMcNeill Jaslynne Dahan, MD [facility] [phone]  If 7pm- 7am, please page neurology on call as listed in AMION.

## 2014-10-17 NOTE — Progress Notes (Signed)
eLink Physician-Brief Progress Note Patient Name: Cody RelicJohn W Schmitt DOB: 05/09/1935 MRN: [id number]   Date of Service  10/17/2014  HPI/Events of Note  Agitated, pulled out CVL QTc .44  eICU Interventions  IM haldol now Place peripheral IV     Intervention Category Major Interventions: Delirium, psychosis, severe agitation - evaluation and management  [name] 10/17/2014, 6:41 PM

## 2014-10-18 ENCOUNTER — Inpatient Hospital Stay (HOSPITAL_COMMUNITY): Payer: Medicare Other

## 2014-10-18 DIAGNOSIS — J9690 Respiratory failure, unspecified, unspecified whether with hypoxia or hypercapnia: Secondary | ICD-10-CM | POA: Diagnosis present

## 2014-10-18 DIAGNOSIS — J9601 Acute respiratory failure with hypoxia: Secondary | ICD-10-CM

## 2014-10-18 LAB — CSF CULTURE
CULTURE: NO GROWTH
GRAM STAIN: NONE SEEN

## 2014-10-18 LAB — CBC
HCT: 31.2 % — ABNORMAL LOW (ref 39.0–52.0)
Hemoglobin: 9.7 g/dL — ABNORMAL LOW (ref 13.0–17.0)
MCH: 27.1 pg (ref 26.0–34.0)
MCHC: 31.1 g/dL (ref 30.0–36.0)
MCV: 87.2 fL (ref 78.0–100.0)
PLATELETS: 94 10*3/uL — AB (ref 150–400)
RBC: 3.58 MIL/uL — ABNORMAL LOW (ref 4.22–5.81)
RDW: 18.5 % — AB (ref 11.5–15.5)
WBC: 6.4 10*3/uL (ref 4.0–10.5)

## 2014-10-18 LAB — COMPREHENSIVE METABOLIC PANEL
ALBUMIN: 1.8 g/dL — AB (ref 3.5–5.0)
ALK PHOS: 112 U/L (ref 38–126)
ALT: 50 U/L (ref 17–63)
ANION GAP: 4 — AB (ref 5–15)
AST: 81 U/L — AB (ref 15–41)
BUN: 29 mg/dL — AB (ref 6–20)
CHLORIDE: 121 mmol/L — AB (ref 101–111)
CO2: 23 mmol/L (ref 22–32)
Calcium: 8.5 mg/dL — ABNORMAL LOW (ref 8.9–10.3)
Creatinine, Ser: 1.34 mg/dL — ABNORMAL HIGH (ref 0.61–1.24)
GFR calc Af Amer: 57 mL/min — ABNORMAL LOW (ref >60)
GFR calc non Af Amer: 49 mL/min — ABNORMAL LOW (ref >60)
Glucose, Bld: 150 mg/dL — ABNORMAL HIGH (ref 70–99)
Potassium: 3.7 mmol/L (ref 3.5–5.1)
Sodium: 148 mmol/L — ABNORMAL HIGH (ref 135–145)
TOTAL PROTEIN: 5.3 g/dL — AB (ref 6.5–8.1)
Total Bilirubin: 1.3 mg/dL — ABNORMAL HIGH (ref 0.3–1.2)

## 2014-10-18 LAB — GLUCOSE, CAPILLARY
GLUCOSE-CAPILLARY: 116 mg/dL — AB (ref 70–99)
GLUCOSE-CAPILLARY: 120 mg/dL — AB (ref 70–99)
GLUCOSE-CAPILLARY: 129 mg/dL — AB (ref 70–99)
GLUCOSE-CAPILLARY: 136 mg/dL — AB (ref 70–99)
GLUCOSE-CAPILLARY: 156 mg/dL — AB (ref 70–99)
Glucose-Capillary: 127 mg/dL — ABNORMAL HIGH (ref 70–99)
Glucose-Capillary: 125 mg/dL — ABNORMAL HIGH (ref 70–99)

## 2014-10-18 LAB — CSF CULTURE W GRAM STAIN

## 2014-10-18 MED ORDER — PANTOPRAZOLE SODIUM 40 MG PO TBEC
40.0000 mg | DELAYED_RELEASE_TABLET | Freq: Every day | ORAL | Status: DC
  Administered 2014-10-18 – 2014-10-28 (×11): 40 mg via ORAL
  Filled 2014-10-18 (×9): qty 1

## 2014-10-18 MED ORDER — DEXMEDETOMIDINE HCL IN NACL 200 MCG/50ML IV SOLN
0.0000 ug/kg/h | INTRAVENOUS | Status: DC
  Administered 2014-10-18: 0.5 ug/kg/h via INTRAVENOUS
  Administered 2014-10-18: 0.307 ug/kg/h via INTRAVENOUS
  Administered 2014-10-18: 0.2 ug/kg/h via INTRAVENOUS
  Administered 2014-10-19: 0.5 ug/kg/h via INTRAVENOUS
  Filled 2014-10-18 (×4): qty 50

## 2014-10-18 MED ORDER — QUETIAPINE FUMARATE 100 MG PO TABS
100.0000 mg | ORAL_TABLET | Freq: Every morning | ORAL | Status: DC
  Administered 2014-10-18 – 2014-10-20 (×3): 100 mg via ORAL
  Filled 2014-10-18 (×3): qty 1

## 2014-10-18 MED ORDER — CEFEPIME HCL 2 G IJ SOLR
2.0000 g | INTRAMUSCULAR | Status: AC
  Administered 2014-10-18: 2 g via INTRAVENOUS
  Filled 2014-10-18 (×2): qty 2

## 2014-10-18 MED ORDER — HALOPERIDOL LACTATE 5 MG/ML IJ SOLN
1.0000 mg | Freq: Four times a day (QID) | INTRAMUSCULAR | Status: DC | PRN
  Administered 2014-10-21: 3 mg via INTRAMUSCULAR
  Filled 2014-10-18 (×2): qty 1

## 2014-10-18 MED ORDER — QUETIAPINE FUMARATE 200 MG PO TABS
200.0000 mg | ORAL_TABLET | Freq: Every day | ORAL | Status: DC
  Administered 2014-10-18 – 2014-10-19 (×2): 200 mg via ORAL
  Filled 2014-10-18 (×3): qty 1

## 2014-10-18 MED ORDER — CETYLPYRIDINIUM CHLORIDE 0.05 % MT LIQD
7.0000 mL | Freq: Two times a day (BID) | OROMUCOSAL | Status: DC
  Administered 2014-10-18 – 2014-10-21 (×8): 7 mL via OROMUCOSAL

## 2014-10-18 MED ORDER — INSULIN ASPART 100 UNIT/ML ~~LOC~~ SOLN
0.0000 [IU] | Freq: Three times a day (TID) | SUBCUTANEOUS | Status: DC
  Administered 2014-10-18 – 2014-10-21 (×9): 2 [IU] via SUBCUTANEOUS
  Administered 2014-10-22: 3 [IU] via SUBCUTANEOUS
  Administered 2014-10-22 – 2014-10-23 (×4): 2 [IU] via SUBCUTANEOUS
  Administered 2014-10-23: 3 [IU] via SUBCUTANEOUS
  Administered 2014-10-24: 2 [IU] via SUBCUTANEOUS
  Administered 2014-10-24 – 2014-10-25 (×3): 3 [IU] via SUBCUTANEOUS
  Administered 2014-10-25 – 2014-10-27 (×6): 2 [IU] via SUBCUTANEOUS
  Administered 2014-10-27: 3 [IU] via SUBCUTANEOUS
  Administered 2014-10-28: 2 [IU] via SUBCUTANEOUS
  Administered 2014-10-28: 3 [IU] via SUBCUTANEOUS

## 2014-10-18 MED ORDER — INSULIN ASPART 100 UNIT/ML ~~LOC~~ SOLN
0.0000 [IU] | Freq: Every day | SUBCUTANEOUS | Status: DC
  Administered 2014-10-20: 2 [IU] via SUBCUTANEOUS

## 2014-10-18 NOTE — Evaluation (Signed)
Physical Therapy Evaluation Patient Details Name: Cody RelicJohn W Schmitt MRN: [id number] DOB: 01/06/1935 Today's Date: 10/18/2014   History of Present Illness  Adm with AMS, sepsis (?aspiration pna); MRI and LP negative; 5/5-5/8 intubated; +MI PMHx- DM, hepatitis C  Clinical Impression  Pt admitted with above diagnosis. Currently pt with deconditioning and requires assistance of 2 people to mobilize OOB. Pt currently with functional limitations due to the deficits listed below (see PT Problem List).  Pt will benefit from skilled PT to increase their independence and safety with mobility to allow discharge to the venue listed below (depending on pt's improved cognition and wife's ability to assist him on d/c).       Follow Up Recommendations CIR    Equipment Recommendations   (TBA when full history obtained (? has RW))    Recommendations for Other Services OT consult;Rehab consult     Precautions / Restrictions Precautions Precautions: Fall Restrictions Weight Bearing Restrictions: No      Mobility  Bed Mobility Overal bed mobility: Needs Assistance Bed Mobility: Supine to Sit;Sit to Sidelying     Supine to sit: HOB elevated;Min assist   Sit to sidelying: Min assist General bed mobility comments: vc for step by step instructions to/from EOB; use of bed pad under hips to assist with turn to sit EOB and scoot to get feet on flooer  Transfers Overall transfer level: Needs assistance Equipment used: Rolling walker (2 wheeled) Transfers: Sit to/from Stand Sit to Stand: Min assist         General transfer comment: incr time and effor to achieve upright; lateral steps [facility] with incr fatigue and return to sit on EOB  Ambulation/Gait             General Gait Details: unable  Stairs            Wheelchair Mobility    Modified Rankin (Stroke Patients Only)       Balance Overall balance assessment: Needs assistance Sitting-balance support: Single extremity  supported;Feet supported Sitting balance-Leahy Scale: Poor Sitting balance - Comments: close supervision due to reports of dizziness (BP actually incr)   Standing balance support: Bilateral upper extremity supported Standing balance-Leahy Scale: Poor Standing balance comment: flexed trunk                             Pertinent Vitals/Pain RR 20-34, SaO2 93-97% on room air BP 135/64 supine       138/74 sit  (+dizziness) HR 88-99          Pain Assessment: No/denies pain    Home Living Family/patient expects to be discharged to:: Private residence Living Arrangements: Spouse/significant other               Additional Comments: pt confused; unable to get full history    Prior Function Level of Independence: Independent with assistive device(s)         Comments: per pt walked with cane; per chart, incr difficulty walking x 3 months     Hand Dominance        Extremity/Trunk Assessment   Upper Extremity Assessment: Overall WFL for tasks assessed           Lower Extremity Assessment: Overall WFL for tasks assessed      Cervical / Trunk Assessment: Normal  Communication   Communication: HOH  Cognition Arousal/Alertness: Awake/alert Behavior During Therapy: Restless Overall Cognitive Status: No family/caregiver present to determine baseline cognitive  functioning                      General Comments      Exercises Low Level/ICU Exercises Ankle Circles/Pumps: AAROM;Both;10 reps Stabilized Bridging: AAROM;Both;Other reps (comment) (assisted with bracing )      Assessment/Plan    PT Assessment Patient needs continued PT services  PT Diagnosis Difficulty walking;Generalized weakness   PT Problem List Decreased strength;Decreased activity tolerance;Decreased balance;Decreased mobility;Decreased cognition;Decreased knowledge of use of DME;Decreased knowledge of precautions;Obesity  PT Treatment Interventions DME instruction;Gait  training;Stair training;Functional mobility training;Therapeutic activities;Therapeutic exercise;Balance training;Cognitive remediation;Patient/family education   PT Goals (Current goals can be found in the Care Plan section) Acute Rehab PT Goals Patient Stated Goal: wants to go home to his wife PT Goal Formulation: With patient Time For Goal Achievement: 11/01/14 Potential to Achieve Goals: Good    Frequency Min 3X/week   Barriers to discharge        Co-evaluation               End of Session Equipment Utilized During Treatment: Gait belt Activity Tolerance: Patient limited by fatigue Patient left: in bed;with call bell/phone within reach;with bed alarm set (chair position) Nurse Communication: Mobility status         Time: [id number] PT Time Calculation (min) (ACUTE ONLY): 32 min   Charges:   PT Evaluation $Initial PT Evaluation Tier I: 1 Procedure PT Treatments $Therapeutic Activity: 8-22 mins   PT G Codes:        Cody Schmitt 10/18/2014, 10:35 AM Pager [phone]

## 2014-10-18 NOTE — Progress Notes (Signed)
Utilization review completed.  

## 2014-10-18 NOTE — Progress Notes (Signed)
Rehab Admissions Coordinator Note:  Patient was screened by Trish MageLogue, Dameka Younker M for appropriateness for an Inpatient Acute Rehab Consult.  At this time, we are recommending Inpatient Rehab consult.  Lelon FrohlichLogue, Elhadji Pecore M 10/18/2014, 1:22 PM  I can be reached at [phone].

## 2014-10-18 NOTE — Progress Notes (Signed)
ANTIBIOTIC CONSULT NOTE - FOLLOW UP  Pharmacy Consult:  Cefepime Indication: rule out sepsis and unknown source  Allergies  Allergen Reactions  . Lisinopril Cough  . Statins Nausea And Vomiting  . Zetia [Ezetimibe] Nausea And Vomiting  . Coumadin [Warfarin Sodium] Rash  . Dilaudid [Hydromorphone Hcl] Itching and Rash    Patient Measurements: Height: 5\' 10"  (177.8 cm) Weight: 281 lb 12 oz (127.8 kg) IBW/kg (Calculated) : 73   Vital Signs: Temp: 98.1 F (36.7 C) (05/10 0000) Temp Source: Oral (05/10 0000) BP: 134/51 mmHg (05/10 0600) Pulse Rate: 74 (05/10 0600) Intake/Output from previous day: 05/09 0701 - 05/10 0700 In: 1634.7 [I.V.:1534.7; IV Piggyback:100] Out: 1690 [Urine:1690]  Labs:  Recent Labs  10/16/14 0348 10/17/14 0556 10/18/14 0300  WBC 6.6 7.0 6.4  HGB 9.6* 9.6* 9.7*  PLT 90* 89* 94*  CREATININE 1.10 1.21 1.34*   Estimated Creatinine Clearance: 62 mL/min (by C-G formula based on Cr of 1.34). No results for input(s): VANCOTROUGH, VANCOPEAK, VANCORANDOM, GENTTROUGH, GENTPEAK, GENTRANDOM, TOBRATROUGH, TOBRAPEAK, TOBRARND, AMIKACINPEAK, AMIKACINTROU, AMIKACIN in the last 72 hours.   Microbiology: Recent Results (from the past 720 hour(s))  MRSA PCR Screening     Status: None   Collection Time: 10/10/14  3:40 PM  Result Value Ref Range Status   MRSA by PCR NEGATIVE NEGATIVE Final    Comment:        The GeneXpert MRSA Assay (FDA approved for NASAL specimens only), is one component of a comprehensive MRSA colonization surveillance program. It is not intended to diagnose MRSA infection nor to guide or monitor treatment for MRSA infections.   Culture, respiratory (NON-Expectorated)     Status: None   Collection Time: 10/13/14  4:00 PM  Result Value Ref Range Status   Specimen Description ENDOTRACHEAL  Final   Special Requests NONE  Final   Gram Stain   Final    RARE WBC PRESENT, PREDOMINANTLY MONONUCLEAR RARE SQUAMOUS EPITHELIAL CELLS PRESENT NO  ORGANISMS SEEN Performed at [facility]    Culture   Final    RARE CANDIDA ALBICANS Performed at [facility]    Report Status 10/15/2014 FINAL  Final  Clostridium Difficile by PCR     Status: None   Collection Time: 10/14/14  4:30 AM  Result Value Ref Range Status   C difficile by pcr NEGATIVE NEGATIVE Final  Gram stain     Status: None   Collection Time: 10/14/14  1:42 PM  Result Value Ref Range Status   Specimen Description CSF  Final   Special Requests NO2  Final   Gram Stain   Final    CYTOSPIN PREP WBC PRESENT, PREDOMINANTLY MONONUCLEAR NO ORGANISMS SEEN    Report Status 10/14/2014 FINAL  Final  CSF culture     Status: None (Preliminary result)   Collection Time: 10/14/14  1:42 PM  Result Value Ref Range Status   Specimen Description FLUID CSF  Final   Special Requests NO2  Final   Gram Stain   Final    NO WBC SEEN NO ORGANISMS SEEN CYTOSPIN Performed at [facility]    Culture   Final    NO GROWTH 2 DAYS Performed at [facility]    Report Status PENDING  Incomplete      Assessment: 79 YOM continues on cefepime for PNA.  Patient's renal function is fluctuating and continue to rise.  Urine output remains decent at 0.6 ml/kg/hr.  Vanc 5/2 >> 5/9 Cefepime 5/2 >> Acyclovir 5/2 >>  5/3  5/6 VT = 14 mcg/mL (drawn 1.5hr late) - continue 1.5gm IV q24  5/1 BCx2 ([facility]) - neg and final 5/2 UCx ([facility]) - neg and final 5/6 CSF - ngtd  5/5 endotrach- rare CA 5/6 C.diff - neg MRSA PCR - neg   Goal of Therapy:  Resolution of infection   Plan:  - Change cefepime to 2gm IV Q24H - Monitor renal fxn, micro data, clinical progress    Brittnei Jagiello D. Laney Potashang, PharmD, BCPS Pager:  [phone] 10/18/2014, 10:50 AM

## 2014-10-18 NOTE — Progress Notes (Signed)
PULMONARY / CRITICAL CARE MEDICINE   Name: Babette RelicJohn W Holquin MRN: [id number] DOB: 06/25/1934    ADMISSION DATE:  10/10/2014  REFERRING MD :  [facility] ED[facility]  CHIEF COMPLAINT:  AMS  INITIAL PRESENTATION:  80 year old male [facility] with AMS and fever. No identifiable source for infection and [facility]  STUDIES:  CT head 5/2 > no acute intracranial issues RUQ u/s 5/03 > cholelithiasis, diffuse coarsening of hepatic parenchymal structure Echo 5/04 > EF 55 to 60%, PAS 39 mmHg EEG 5/05 >> generalized slowing MRI brain 5/05 >> normal LP 5/06 >> glucose 83, protein 133, RBC 251, WBC 1 Doppler left leg 5/06 >> no DVT  SIGNIFICANT EVENTS: 5/2 [facility]4 Neuro consulted 5/5 intubated 5/6 off pressors 5/08 extubated 5/09 Very agitated with continuous Precedex reqt. Failed SLP swallow eval. NPO recommended 5/10 More alert. Remains poorly oriented. Quetiapine initiated in effort to wean off dexmedetomidine  SUBJECTIVE:  Less agitated. Still poorly oriented and occasionally combative  VITAL SIGNS: Temp:  [97.8 F (36.6 C)-99.4 F (37.4 C)] 98.3 F (36.8 C) (05/10 1551) Pulse Rate:  [63-91] 91 (05/10 1600) Resp:  [16-33] 22 (05/10 1600) BP: (120-164)/(46-127) 154/68 mmHg (05/10 1600) SpO2:  [93 %-98 %] 94 % (05/10 1600) Weight:  [127.8 kg (281 lb 12 oz)] 127.8 kg (281 lb 12 oz) (05/10 0400) VENT SETTINGS:   INTAKE / OUTPUT:  Intake/Output Summary (Last 24 hours) at 10/18/14 1740 Last data filed at 10/18/14 1245  Gross per 24 hour  Intake 934.59 ml  Output   1565 ml  Net -630.41 ml    PHYSICAL EXAMINATION: General: no distress Neuro:  RASS 0 to +1. No focal deficits HEENT: WNL Cardiovascular: regular, No M Lungs: Clear Abdomen:  Soft, non tender Musculoskeletal: 2-3+ ankle and pedal edema Skin: no rashes  LABS:  CBC  Recent Labs Lab 10/16/14 0348 10/17/14 0556 10/18/14 0300  WBC 6.6 7.0 6.4  HGB  9.6* 9.6* 9.7*  HCT 30.6* 30.2* 31.2*  PLT 90* 89* 94*   Coag's No results for input(s): APTT, INR in the last 168 hours. BMET  Recent Labs Lab 10/16/14 0348 10/17/14 0556 10/18/14 0300  NA 150* 150* 148*  K 3.9 3.8 3.7  CL 123* 122* 121*  CO2 21* 22 23  BUN 36* 33* 29*  CREATININE 1.10 1.21 1.34*  GLUCOSE 195* 135* 150*   Electrolytes  Recent Labs Lab 10/13/14 1201 10/14/14 0110  10/15/14 0311 10/16/14 0348 10/17/14 0556 10/18/14 0300  CALCIUM  --   --   < > 8.2* 8.4* 8.5* 8.5*  MG 2.0 2.1  --  2.1 2.0  --   --   PHOS 3.5 3.3  --  3.6  --   --   --   < > = values in this interval not displayed.   Sepsis Markers  Recent Labs Lab 10/12/14 0515  PROCALCITON 1.64   Liver Enzymes  Recent Labs Lab 10/16/14 0348 10/17/14 0556 10/18/14 0300  AST 46* 51* 81*  ALT 37 38 50  ALKPHOS 133* 116 112  BILITOT 0.9 1.1 1.3*  ALBUMIN 1.7* 1.7* 1.8*   Cardiac Enzymes No results for input(s): TROPONINI, PROBNP in the last 168 hours. Glucose  Recent Labs Lab 10/17/14 2001 10/18/14 0034 10/18/14 0319 10/18/14 0807 10/18/14 1155 10/18/14 1536  GLUCAP 122* 120* 127* 136* 116* 129*      Lab Results  Component Value Date   TSH 1.156 10/10/2014  CXR: NSC RLL opacity   ASSESSMENT / PLAN:  PULMONARY A: Acute respiratory failure, resolved RLL infiltrate - suspected aspiration P:   Cont supp O2 as needed Continue brovana, pulmicort and prn albuterol  CARDIOVASCULAR Rt Chase CVL 5/02 >> 5/10 A:  Septic shock, resolved Hx of HTN NSTEMI P:  Continue ASA Holding lasix, KCl, HCTZ, losartan, metoprolol, red yeast rice  RENAL A:   CKD stage 3 AKI, nonoliguric Anion gap metabolic acidosis, resolved Hypernatremia Hypokalemia, resolved P:   Monitor BMET intermittently Monitor I/Os Correct electrolytes as indicated Cont D5W  GASTROINTESTINAL A:   Abdominal tenderness, resolved Hx of Hep C Abn liver US, ?cirrhosis P:   SUP: N/I post  extubation Advance diet as tolerated  HEMATOLOGIC A:   Anemia without acute blood loss Thrombocytopenia, suspect chronic P:  DVT px: SQ heparin Monitor CBC intermittently Transfuse per usual ICU guidelines   INFECTIOUS A:   Severe sepsis Aspiration PNA Diarrhea >> C diff PCR negative 5/06. P:   Vanc 5/02 >> 5/09 Cefepime 5/02 >> 5/10  Blood (OSH) 5/02 >> NEG Urine (OSH) 5/02 >> negative CSF 5/06 >> NEG  ENDOCRINE A:   DM 2, adequately controlled P:   Cont SSI - changed to ACHS 5/10 Holding home onglyza  NEUROLOGIC A:   Acute encephalopathy, unclear etiology Severe agitation P:   Wean off precedex for RASS goal 0 Quetiapine initiated 5/10  Billy Fischeravid Simonds, MD ; [facility] service Mobile [phone].  After 5:30 PM or weekends, call [phone]

## 2014-10-18 NOTE — Progress Notes (Signed)
Pt fidgeting with items in bed. Given activity belt to keep him occupied.

## 2014-10-18 NOTE — Progress Notes (Signed)
Speech Language Pathology Treatment: Dysphagia  Patient Details Name: Cody Schmitt MRN: [id number] DOB: 1934-06-11 Today's Date: 10/18/2014 Time: 1217-1229 SLP Time Calculation (min) (ACUTE ONLY): 12 min  Assessment / Plan / Recommendation Clinical Impression  Pt seen for f/u after yesterday's clinical swallow eval.  Presented initially with confusion -  mentation cleared after a few minutes of discussion.  Consumed crackers, purees, water from straw with adequate mastication, swift swallow response, no overt s/s of aspiration.  Phonation quality improved.  Dysphagia resolved.  Recommend regular diet, thin liquids.  No SLP f/u warranted - will sign off. D/W RN.   HPI Other Pertinent Information: 80 year old male presented to [place] with AMS and fever. No identifiable source for infection and was transferred to [facility] for further workup. Intubated 5/5-5/8. Diagnosed with acute respiratory failure secondary to aspiration. Required NT suctioning with removal of thick secretions from back of throat prior to intubation. PMH of GERD, diabetes, HTN, asthma.    Pertinent Vitals Pain Assessment: No/denies pain  SLP Plan  All goals met    Recommendations Diet recommendations: Regular;Thin liquid Liquids provided via: Straw Medication Administration: Whole meds with puree Supervision: Patient able to self feed Postural Changes and/or Swallow Maneuvers: Seated upright 90 degrees              Oral Care Recommendations: Oral care BID Follow up Recommendations: None Plan: All goals met   Meiko Stranahan L. Tivis Ringer, [place] CCC/SLP Pager [phone]      Juan Quam Laurice 10/18/2014, 12:33 PM

## 2014-10-18 NOTE — Progress Notes (Signed)
Subjective: Awake, following commands, more vocal today  Exam: Filed Vitals:   10/18/14 0811  BP:   Pulse:   Temp: 99.3 F (37.4 C)  Resp:     HEENT-  Normocephalic, no lesions, without obvious abnormality.  Normal external eye and conjunctiva.  Normal TM's bilaterally.  Normal auditory canals and external ears. Normal external nose, mucus membranes and septum.  Normal pharynx. Cardiovascular- S1, S2 normal, pulses palpable throughout   Lungs- chest clear, no wheezing, rales, normal symmetric air entry Abdomen- normal findings: bowel sounds normal Extremities- no edema Lymph-no adenopathy palpable Musculoskeletal-no joint tenderness, deformity or swelling Skin-warm and dry, no hyperpigmentation, vitiligo, or suspicious lesions    Gen: In bed, NAD MS: alert, oriented to the fact he is in a place that "heals people", able to follow commands and speech is clear.  Able to name objects such as pen and watch.  CN: PERRLA,EOMI, TML, face symmetric, sensation intact.  Motor: moving all extremities antigravity Sensory: grossly intact throughout DTR: 1+ in UE with no AJ or KJ   Pertinent Labs: Na+ 148 Cl 121 Cr 1.34 AST 81 PLT 94   Felicie MornDavid Smith PA-C [facility] [phone]  Impression:79 YO presenting with AMS. MRI and LP unremarkable. Patient now awake and able to follow both verbal (hard of hearing) and visual commands. Still with apparent delirium and requiring sedation with precedex but improving. Suspect a multifactorial metabolic encephalopathy.    Recommendations: 1) continue present management  Ritta SlotMcNeill Yazleen Molock, MD [facility] [phone]  If 7pm- 7am, please page neurology on call as listed in AMION.  10/18/2014, 9:10 AM

## 2014-10-19 ENCOUNTER — Inpatient Hospital Stay (HOSPITAL_COMMUNITY): Payer: Medicare Other

## 2014-10-19 DIAGNOSIS — R5381 Other malaise: Secondary | ICD-10-CM

## 2014-10-19 DIAGNOSIS — E870 Hyperosmolality and hypernatremia: Secondary | ICD-10-CM | POA: Diagnosis present

## 2014-10-19 LAB — CBC
HCT: 28 % — ABNORMAL LOW (ref 39.0–52.0)
HEMOGLOBIN: 8.7 g/dL — AB (ref 13.0–17.0)
MCH: 26.6 pg (ref 26.0–34.0)
MCHC: 31.1 g/dL (ref 30.0–36.0)
MCV: 85.6 fL (ref 78.0–100.0)
PLATELETS: 75 10*3/uL — AB (ref 150–400)
RBC: 3.27 MIL/uL — AB (ref 4.22–5.81)
RDW: 18.8 % — ABNORMAL HIGH (ref 11.5–15.5)
WBC: 5.7 10*3/uL (ref 4.0–10.5)

## 2014-10-19 LAB — BASIC METABOLIC PANEL
ANION GAP: 8 (ref 5–15)
BUN: 28 mg/dL — ABNORMAL HIGH (ref 6–20)
CHLORIDE: 117 mmol/L — AB (ref 101–111)
CO2: 21 mmol/L — ABNORMAL LOW (ref 22–32)
Calcium: 8.1 mg/dL — ABNORMAL LOW (ref 8.9–10.3)
Creatinine, Ser: 1.38 mg/dL — ABNORMAL HIGH (ref 0.61–1.24)
GFR, EST AFRICAN AMERICAN: 55 mL/min — AB (ref >60)
GFR, EST NON AFRICAN AMERICAN: 48 mL/min — AB (ref >60)
Glucose, Bld: 156 mg/dL — ABNORMAL HIGH (ref 70–99)
POTASSIUM: 3.5 mmol/L (ref 3.5–5.1)
Sodium: 146 mmol/L — ABNORMAL HIGH (ref 135–145)

## 2014-10-19 LAB — GLUCOSE, CAPILLARY
GLUCOSE-CAPILLARY: 129 mg/dL — AB (ref 70–99)
Glucose-Capillary: 126 mg/dL — ABNORMAL HIGH (ref 70–99)
Glucose-Capillary: 128 mg/dL — ABNORMAL HIGH (ref 70–99)
Glucose-Capillary: 133 mg/dL — ABNORMAL HIGH (ref 70–99)
Glucose-Capillary: 133 mg/dL — ABNORMAL HIGH (ref 70–99)
Glucose-Capillary: 163 mg/dL — ABNORMAL HIGH (ref 70–99)

## 2014-10-19 MED ORDER — FUROSEMIDE 10 MG/ML IJ SOLN: 40.0000 mg | Freq: Once | INTRAMUSCULAR | Status: DC

## 2014-10-19 MED ORDER — POTASSIUM CHLORIDE CRYS ER 20 MEQ PO TBCR
40.0000 meq | EXTENDED_RELEASE_TABLET | Freq: Two times a day (BID) | ORAL | Status: AC
  Administered 2014-10-19 (×2): 40 meq via ORAL
  Filled 2014-10-19 (×2): qty 2

## 2014-10-19 MED ORDER — FUROSEMIDE 10 MG/ML IJ SOLN
40.0000 mg | Freq: Two times a day (BID) | INTRAMUSCULAR | Status: AC
  Administered 2014-10-19 (×2): 40 mg via INTRAVENOUS
  Filled 2014-10-19 (×2): qty 4

## 2014-10-19 MED ORDER — POTASSIUM CHLORIDE CRYS ER 20 MEQ PO TBCR: 40.0000 meq | EXTENDED_RELEASE_TABLET | Freq: Once | ORAL | Status: DC

## 2014-10-19 NOTE — Consult Note (Signed)
Physical Medicine and Rehabilitation Consult Reason for Consult: Acute encephalopathy/multi-medical Referring Physician: Critical care   HPI: Cody Schmitt is a 80 y.o. right handed male with history of hypertension, diabetes mellitus peripheral neuropathy and hepatitis C. Patient initially presented to [facility] with agitation, altered mental status and fever 10/10/2014. Cranial CT scan negative. Chest x-ray and urine drug screen negative. LP attempted in the ED but unsuccessful. He was transferred to [facility] for further evaluation. Placed on broad-spectrum antibiotics. Noted white blood cell count 15,500, ammonia level of 50, creatinine 1.53. MRI of the head 10/13/2014 without acute changes. LP was completed once a [place] showed glucose 83, protein 133, RBC 251, WBC 1. Venous Doppler studies negative for DVT. Neurology follow-up suspect multifactorial metabolic encephalopathy. Continues to show improvement with delirium and maintained on Seroquel with a sitter for safety. Subcutaneous heparin for DVT prophylaxis. Physical therapy evaluation completed with recommendations of physical medicine rehabilitation consult.   Review of Systems  Unable to perform ROS: mental acuity   Past Medical History  Diagnosis Date  . HTN (hypertension)   . Asthma   . Hepatitis C   . Dyslipidemia   . GERD (gastroesophageal reflux disease)   . Diabetes mellitus without complication    Past Surgical History  Procedure Laterality Date  . Colonoscopy    . Cataract surgery     Family History  Problem Relation Age of Onset  . Hypertension Mother   . Hypertension Father    Social History:  reports that he quit smoking about 32 years ago. His smoking use included Cigarettes. He has a 10 pack-year smoking history. He does not have any smokeless tobacco history on file. His alcohol and drug histories are not on file. Allergies:  Allergies  Allergen Reactions  . Lisinopril Cough   . Statins Nausea And Vomiting  . Zetia [Ezetimibe] Nausea And Vomiting  . Coumadin [Warfarin Sodium] Rash  . Dilaudid [Hydromorphone Hcl] Itching and Rash   Medications Prior to Admission  Medication Sig Dispense Refill  . acetaminophen (TYLENOL) 650 MG CR tablet Take 1,300 mg by mouth 2 (two) times daily.    Marland Kitchen albuterol (PROVENTIL HFA;VENTOLIN HFA) 108 (90 BASE) MCG/ACT inhaler Inhale 2 puffs into the lungs 2 (two) times daily.     Marland Kitchen arformoterol (BROVANA) 15 MCG/2ML NEBU Take 15 mcg by nebulization 2 (two) times daily.    Marland Kitchen aspirin EC 81 MG tablet Take 81 mg by mouth daily.    . Cholecalciferol (VITAMIN D) 2000 UNITS tablet Take 2,000 Units by mouth daily.    . clotrimazole-betamethasone (LOTRISONE) cream Apply 1 application topically 2 (two) times daily as needed (rash).     . furosemide (LASIX) 40 MG tablet Take 40-80 mg by mouth 2 (two) times daily. Take 2 tablets (80 mg) daily at 6am and 1 tablet (40 mg) daily at 4pm    . hydrochlorothiazide (HYDRODIURIL) 25 MG tablet Take 25 mg by mouth daily.    . insulin glargine (LANTUS) 100 UNIT/ML injection Inject 25 Units into the skin at bedtime.     Marland Kitchen losartan (COZAAR) 100 MG tablet Take 100 mg by mouth daily.    . metoprolol succinate (TOPROL-XL) 25 MG 24 hr tablet Take 25 mg by mouth daily.    . mometasone (ASMANEX) 220 MCG/INH inhaler Inhale 2 puffs into the lungs daily.    . Omega-3 Fatty Acids (FISH OIL) 1200 MG CAPS Take 2,400 mg by mouth daily.    Marland Kitchen  omeprazole (PRILOSEC) 20 MG capsule Take 20 mg by mouth daily.    . Polyvinyl Alcohol-Povidone (REFRESH OP) Place 1 drop into both eyes daily as needed (dry eyes).    . potassium chloride SA (K-DUR,KLOR-CON) 20 MEQ tablet Take 20-40 mEq by mouth 2 (two) times daily. Take 2 tablets (40 meq) daily at 6am and 1 tablet (20 meq) daily at 4pm    . Red Yeast Rice 600 MG CAPS Take 1,200 mg by mouth daily.    . saxagliptin HCl (ONGLYZA) 5 MG TABS tablet Take 5 mg by mouth daily.    . [DISCONTINUED]  amoxicillin (AMOXIL) 500 MG capsule Take 2,000 mg by mouth See admin instructions. Take 4 tablets (2000 mg) by mouth 1 hour prior to dental procedure      Home: Home Living Family/patient expects to be discharged to:: Private residence Living Arrangements: Spouse/significant other Additional Comments: pt confused; unable to get full history  Functional History: Prior Function Level of Independence: Independent with assistive device(s) Comments: per pt walked with cane; per chart, incr difficulty walking x 3 months Functional Status:  Mobility: Bed Mobility Overal bed mobility: Needs Assistance Bed Mobility: Supine to Sit, Sit to Sidelying Supine to sit: HOB elevated, Min assist Sit to sidelying: Min assist General bed mobility comments: vc for step by step instructions to/from EOB; use of bed pad under hips to assist with turn to sit EOB and scoot to get feet on flooer Transfers Overall transfer level: Needs assistance Equipment used: Rolling walker (2 wheeled) Transfers: Sit to/from Stand Sit to Stand: Min assist General transfer comment: incr time and effor to achieve upright; lateral steps x 2 toward [facility] with incr fatigue and return to sit on EOB Ambulation/Gait General Gait Details: unable    ADL:    Cognition: Cognition Overall Cognitive Status: No family/caregiver present to determine baseline cognitive functioning Orientation Level: Oriented to person, Disoriented to place, Disoriented to situation, Disoriented to time Cognition Arousal/Alertness: Awake/alert Behavior During Therapy: Restless Overall Cognitive Status: No family/caregiver present to determine baseline cognitive functioning  Blood pressure 143/117, pulse 109, temperature 100.1 F (37.8 C), temperature source Oral, resp. rate 21, height  (1.778 m), weight 131.5 kg (289 lb 14.5 oz), SpO2 95 %. Physical Exam  Vitals reviewed. HENT:  Head: Normocephalic.  Eyes: EOM are normal.  Neck: Normal  range of motion. Neck supple. No thyromegaly present.  Cardiovascular: Normal rate and regular rhythm.   Respiratory: Effort normal and breath sounds normal. No respiratory distress.  GI: Soft. Bowel sounds are normal. He exhibits no distension.  Neurological: He is alert. Coordination abnormal.  Bilateral mittens in place. Patient easily distracted. He was able to provide his age but needed multiple cues for place. Very poor awareness. Moves all extremities. Speech dysarthric. Language of confusion. Attention span of seconds. Strength 2- to 2/5 throughout. Fairly alert. Mild intentional tremor bilaterally.   Skin: Skin is warm and dry.  Psychiatric:  Distracted, confused    Results for orders placed or performed during the hospital encounter of 10/10/14 (from the past 24 hour(s))  Glucose, capillary     Status: Abnormal   Collection Time: 10/18/14  3:36 PM  Result Value Ref Range   Glucose-Capillary 129 (H) 70 - 99 mg/dL  Glucose, capillary     Status: Abnormal   Collection Time: 10/18/14  7:13 PM  Result Value Ref Range   Glucose-Capillary 156 (H) 70 - 99 mg/dL  Glucose, capillary     Status: Abnormal  Collection Time: 10/18/14  9:30 PM  Result Value Ref Range   Glucose-Capillary 125 (H) 70 - 99 mg/dL  Glucose, capillary     Status: Abnormal   Collection Time: 10/18/14 11:47 PM  Result Value Ref Range   Glucose-Capillary 133 (H) 70 - 99 mg/dL   Comment 1 Notify RN   Basic metabolic panel     Status: Abnormal   Collection Time: 10/19/14  2:14 AM  Result Value Ref Range   Sodium 146 (H) 135 - 145 mmol/L   Potassium 3.5 3.5 - 5.1 mmol/L   Chloride 117 (H) 101 - 111 mmol/L   CO2 21 (L) 22 - 32 mmol/L   Glucose, Bld 156 (H) 70 - 99 mg/dL   BUN 28 (H) 6 - 20 mg/dL   Creatinine, Ser 1.611.38 (H) 0.61 - 1.24 mg/dL   Calcium 8.1 (L) 8.9 - 10.3 mg/dL   GFR calc non Af Amer 48 (L) >60 mL/min   GFR calc Af Amer 55 (L) >60 mL/min   Anion gap 8 5 - 15  CBC     Status: Abnormal    Collection Time: 10/19/14  2:14 AM  Result Value Ref Range   WBC 5.7 4.0 - 10.5 K/uL   RBC 3.27 (L) 4.22 - 5.81 MIL/uL   Hemoglobin 8.7 (L) 13.0 - 17.0 g/dL   HCT 09.628.0 (L) 04.539.0 - 40.952.0 %   MCV 85.6 78.0 - 100.0 fL   MCH 26.6 26.0 - 34.0 pg   MCHC 31.1 30.0 - 36.0 g/dL   RDW 81.118.8 (H) 91.411.5 - 78.215.5 %   Platelets 75 (L) 150 - 400 K/uL  Glucose, capillary     Status: Abnormal   Collection Time: 10/19/14  3:57 AM  Result Value Ref Range   Glucose-Capillary 163 (H) 70 - 99 mg/dL   Comment 1 Notify RN   Glucose, capillary     Status: Abnormal   Collection Time: 10/19/14  8:17 AM  Result Value Ref Range   Glucose-Capillary 133 (H) 70 - 99 mg/dL   Dg Chest Port 1 View  10/19/2014   CLINICAL DATA:  Respiratory failure  EXAM: PORTABLE CHEST - 1 VIEW  COMPARISON:  10/18/2014  FINDINGS: Cardiac shadow is stable. Diffuse right-sided infiltrate is again noted. Vascular congestion and interstitial edema is seen which is new from the prior study. The left lung show slow lower lobe atelectatic changes.  IMPRESSION: Increasing central vascular congestion with pulmonary edema.  Stable infiltrative changes on the right.  New left basilar atelectasis.   Electronically Signed   By: Alcide CleverMark  Lukens M.D.   On: 10/19/2014 07:45   Dg Chest Port 1 View  10/18/2014   CLINICAL DATA:  Respiratory failure.  EXAM: PORTABLE CHEST - 1 VIEW  COMPARISON:  10/17/2014.  FINDINGS: Interim removal right subclavian line. Mediastinum and hilar structures are normal. Stable diffuse right lung infiltrate is present. Mild left base subsegmental atelectasis scratched is present . No pleural effusion or pneumothorax. Cardiomegaly with normal pulmonary vascularity.  IMPRESSION: 1. Diffuse right lung infiltrate again noted. No interim improvement. 2. Mild left base subsegmental atelectasis. 3. Stable cardiomegaly.   Electronically Signed   By: Maisie Fushomas  Register   On: 10/18/2014 07:28    Assessment/Plan: Diagnosis: encephalopathy of unlcear  origin 1. Does the need for close, 24 hr/day medical supervision in concert with the patient's rehab needs make it unreasonable for this patient to be served in a less intensive setting? Yes and Potentially 2. Co-Morbidities requiring supervision/potential complications:  pneumonia, hypernatremia, IDDM 3. Due to bladder management, bowel management, safety, skin/wound care, disease management, medication administration, pain management and patient education, does the patient require 24 hr/day rehab nursing? Yes 4. Does the patient require coordinated care of a physician, rehab nurse, PT (1-2 hrs/day, 5 days/week), OT (1-2 hrs/day, 5 days/week) and SLP (1-2 hrs/day, 5 days/week) to address physical and functional deficits in the context of the above medical diagnosis(es)? Yes and Potentially Addressing deficits in the following areas: balance, endurance, locomotion, strength, transferring, bowel/bladder control, bathing, dressing, feeding, grooming, toileting, cognition, speech, language, swallowing and psychosocial support 5. Can the patient actively participate in an intensive therapy program of at least 3 hrs of therapy per day at least 5 days per week? Potentially 6. The potential for patient to make measurable gains while on inpatient rehab is fair 7. Anticipated functional outcomes upon discharge from inpatient rehab are min assist  with PT, min assist with OT, min assist with SLP. 8. Estimated rehab length of stay to reach the above functional goals is: ?16-24 days 9. Does the patient have adequate social supports and living environment to accommodate these discharge functional goals? Potentially 10. Anticipated D/C setting: Home 11. Anticipated post D/C treatments: HH therapy and Outpatient therapy 12. Overall Rehab/Functional Prognosis: good  RECOMMENDATIONS: This patient's condition is appropriate for continued rehabilitative care in the following setting: potentially CIR Patient has agreed  to participate in recommended program. N/A Note that insurance prior authorization may be required for reimbursement for recommended care.  Comment: Given the unclear etiology of his encephalopathy and inability to predict recovery, his spouse will need to understand that he may still require substantial care even after an inpatient rehab admission. Rehab Admissions Coordinator to follow up.  Thanks,  Ranelle OysterZachary T. Bebe Moncure, MD, [place] Dom[facility]APMR     10/19/2014

## 2014-10-19 NOTE — Progress Notes (Signed)
PULMONARY / CRITICAL CARE MEDICINE   Name: Cody Schmitt MRN: [id number] DOB: 12/03/1934    ADMISSION DATE:  10/10/2014  REFERRING MD :  [facility] EDP  CHIEF COMPLAINT:  AMS  INITIAL PRESENTATION:  80 year old [facility] with AMS and fever. No identifiable source for infection and [facility].  STUDIES:  CT head 5/2 > no acute intracranial issues RUQ u/s 5/03 > cholelithiasis, diffuse coarsening of hepatic parenchymal structure Echo 5/04 > EF 55 to 60%, PAS 39 mmHg EEG 5/05 >> generalized slowing MRI brain 5/05 >> normal LP 5/06 >> glucose 83, protein 133, RBC 251, WBC 1 Doppler left leg 5/06 >> no DVT  SIGNIFICANT EVENTS: 5/2 [facility] 5/4 Neuro consulted 5/5 intubated 5/6 off pressors 5/08 extubated 5/09 Very agitated with continuous Precedex reqt. Failed SLP swallow eval. NPO recommended 5/10 More alert. Remains poorly oriented. Quetiapine initiated in effort to wean off dexmedetomidine 5/11 agitation improved. Tolerated DC of dexmedetomidine. Lasix and KCl ordered for severe volume overload  SUBJECTIVE:  Less agitated. Still poorly oriented. RASS 0, + F/C  VITAL SIGNS: Temp:  [98.3 F (36.8 C)-99.6 F (37.6 C)] 99.3 F (37.4 C) (05/11 0823) Pulse Rate:  [69-91] 88 (05/11 0900) Resp:  [14-33] 17 (05/11 0900) BP: (93-180)/(33-142) 158/131 mmHg (05/11 0900) SpO2:  [93 %-97 %] 96 % (05/11 0919) Weight:  [131.5 kg (289 lb 14.5 oz)] 131.5 kg (289 lb 14.5 oz) (05/11 0600) VENT SETTINGS:   INTAKE / OUTPUT:  Intake/Output Summary (Last 24 hours) at 10/19/14 1140 Last data filed at 10/19/14 0930  Gross per 24 hour  Intake 2111.52 ml  Output   1105 ml  Net 1006.52 ml    PHYSICAL EXAMINATION: General: no distress Neuro:  RASS 0. No focal deficits HEENT: WNL Cardiovascular: regular, No M Lungs: Clear Abdomen:  Soft, non tender Musculoskeletal: 3+ ankle and pedal edema Skin: no  rashes  LABS:  CBC  Recent Labs Lab 10/17/14 0556 10/18/14 0300 10/19/14 0214  WBC 7.0 6.4 5.7  HGB 9.6* 9.7* 8.7*  HCT 30.2* 31.2* 28.0*  PLT 89* 94* 75*   Coag's No results for input(s): APTT, INR in the last 168 hours. BMET  Recent Labs Lab 10/17/14 0556 10/18/14 0300 10/19/14 0214  NA 150* 148* 146*  K 3.8 3.7 3.5  CL 122* 121* 117*  CO2 22 23 21*  BUN 33* 29* 28*  CREATININE 1.21 1.34* 1.38*  GLUCOSE 135* 150* 156*   Electrolytes  Recent Labs Lab 10/13/14 1201 10/14/14 0110  10/15/14 0311 10/16/14 0348 10/17/14 0556 10/18/14 0300 10/19/14 0214  CALCIUM  --   --   < > 8.2* 8.4* 8.5* 8.5* 8.1*  MG 2.0 2.1  --  2.1 2.0  --   --   --   PHOS 3.5 3.3  --  3.6  --   --   --   --   < > = values in this interval not displayed.   Sepsis Markers No results for input(s): LATICACIDVEN, PROCALCITON, O2SATVEN in the last 168 hours. Liver Enzymes  Recent Labs Lab 10/16/14 0348 10/17/14 0556 10/18/14 0300  AST 46* 51* 81*  ALT 37 38 50  ALKPHOS 133* 116 112  BILITOT 0.9 1.1 1.3*  ALBUMIN 1.7* 1.7* 1.8*   Cardiac Enzymes No results for input(s): TROPONINI, PROBNP in the last 168 hours. Glucose  Recent Labs Lab 10/18/14 1536 10/18/14 1913 10/18/14 2130 10/18/14 2347 10/19/14 0357 10/19/14 [id number]  GLUCAP 129* 156* 125* 133* 163* 133*      Lab Results  Component Value Date   TSH 1.156 10/10/2014   CXR: NSC to increased edema pattern    ASSESSMENT / PLAN:  PULMONARY A: Acute respiratory failure, resolved Pulm edema P:   Cont supp O2 as needed Continue brovana, pulmicort and prn albuterol Lasix X 2 doses ordered 5/11  CARDIOVASCULAR Rt [place] CVL 5/02 >> 5/10 A:  Shock, resolved Hx of HTN NSTEMI P:  Continue ASA Consider resumption of scheduled furosemide and KCl 5/12  RENAL A:   CKD stage 3 AKI, nonoliguric Anion gap metabolic acidosis, resolved Hypernatremia, s.oly improving Hypokalemia, resolved P:   Monitor BMET  intermittently Monitor I/Os Correct electrolytes as indicated Cont D5W  GASTROINTESTINAL A:   Abdominal tenderness, resolved Hx of Hep C Abn liver US, ?cirrhosis P:   SUP: N/I post extubation Cont diet  HEMATOLOGIC A:   Anemia without acute blood loss Thrombocytopenia, suspect chronic P:  DVT px: SQ heparin Monitor CBC intermittently Transfuse per usual ICU guidelines  INFECTIOUS A:   Severe sepsis, resolved - etiology never discerned.  Possible aspiration PNA Diarrhea >> C diff PCR negative 5/06. P:   Vanc 5/02 >> 5/09 Cefepime 5/02 >> 5/10  Blood (OSH) 5/02 >> NEG Urine (OSH) 5/02 >> negative CSF 5/06 >> NEG  ENDOCRINE A:   DM 2, adequately controlled P:   Cont SSI - changed to ACHS 5/10 Resume home onglyza  NEUROLOGIC A:   Acute encephalopathy, unclear etiology - improving Severe agitation, resolved Deconditioning P:   Cont quetiapine initiated 5/10 Neurology has signed off as of 5/11  Final rec is consider VPA if agitated delirium persists Cont PT Will likely need rehab vs SNF  Transfer to SDU. TRH to assume care as of AM 5/12 and PCCM will sign off. Discussed with Dr Nichola SizerMcClung  Seniya Stoffers, MD ; [facility] service Mobile [phone].  After 5:30 PM or weekends, call [phone]

## 2014-10-19 NOTE — Progress Notes (Signed)
Nutrition Follow-up  DOCUMENTATION CODES:  Morbid obesity  INTERVENTION:   (Continue heart heatlhy CHO-modified diet to provide estimated nutrition needs.)  NUTRITION DIAGNOSIS:  Inadequate oral intake related to  (recent intubation) as evidenced by  (50% meal completion).  Ongoing.  GOAL:  Patient will meet greater than or equal to 90% of their needs  Progressing.  MONITOR:  PO intake, Weight trends, Labs   ASSESSMENT:  [facility] and fever; [facility]. Required intubation on 5/5.   Patient was extubated on 5/8. Diet has been advanced to heart healthy, CHO modified. Patient is eating well per discussion with RN. 50% intake of breakfast recorded this AM. No supplements indicated at this time.  Height:  Ht Readings from Last 1 Encounters:  10/13/14 5\' 10"  (1.778 m)    Weight:  Wt Readings from Last 1 Encounters:  10/19/14 289 lb 14.5 oz (131.5 kg)   10/13/14 278 lb 10.6 oz (126.4 kg)        Ideal Body Weight:  75.5 kg   BMI:  Body mass index is 41.6 kg/(m^2).  Estimated Nutritional Needs:  Kcal:  2000-2200  Protein:  120-140 gm  Fluid:  2-2.2 L  Skin:  Wound (see comment) (stage 1 pressure ulcer to back)  Diet Order:  Diet heart healthy/carb modified Room service appropriate?: Yes with Assist; Fluid consistency:: Thin  EDUCATION NEEDS:  No education needs identified at this time   Intake/Output Summary (Last 24 hours) at 10/19/14 1054 Last data filed at 10/19/14 0930  Gross per 24 hour  Intake 2202.32 ml  Output   1105 ml  Net 1097.32 ml    Last BM:  5/10   Joaquin CourtsKimberly Lavern Crimi, RD, LDN, CNSC Pager [phone] After Hours Pager [phone]

## 2014-10-19 NOTE — Progress Notes (Signed)
Subjective: patient alert but remains confused.  Last night he told nurses he was hallucinating and he was aware the images were not real.   Exam: Filed Vitals:   10/19/14 0823  BP:   Pulse:   Temp: 99.3 F (37.4 C)  Resp:     HEENT- Normocephalic, no lesions, without obvious abnormality. Normal external eye and conjunctiva. Normal TM's bilaterally. Normal auditory canals and external ears. Normal external nose, mucus membranes and septum. Normal pharynx. Cardiovascular- S1, S2 normal, pulses palpable throughout  Lungs- chest clear, no wheezing, rales, normal symmetric air entry Abdomen- normal findings: bowel sounds normal Extremities- no edema Lymph-no adenopathy palpable Musculoskeletal-no joint tenderness, deformity or swelling Skin-warm and dry, no hyperpigmentation, vitiligo, or suspicious lesions   Gen: In bed, NAD MS: alert, not oriented to place, moth or year.  He is able to state he feels he has a lot of "gas". Able to name objects and follow three step commands.  CN: PERRLA,EOMI, TML, face symmetric, sensation intact.  Motor: moving all extremities antigravity Sensory: grossly intact throughout DTR: 1+ in UE with no AJ or KJ  Pertinent Labs: Na+ 146 Cl 117 BUN 28 Cr 1.38  Felicie MornDavid Smith PA-C [facility] [phone]  Impression: 80 YO presenting with AMS. MRI and LP unremarkable. Patient now awake and able to follow both verbal (hard of hearing) and visual commands. Still with apparent delirium and precedex has been D/C'd but improving. Seroquel restarted. Suspect a multifactorial metabolic encephalopathy.   He has significantly more insight now than previously, and   Recommendations: 1) Agree with qhs seroquel 2) If delirium remains an issue, could start depakote 500mg  3) Please call if mental status does not continue to gradually improve or if further questions remain. Neurology will sign off at this time.    Ritta SlotMcNeill Kirkpatrick,  MD [facility] [phone]  If 7pm- 7am, please page neurology on call as listed in AMION. 10/19/2014, 9:03 AM

## 2014-10-20 ENCOUNTER — Inpatient Hospital Stay (HOSPITAL_COMMUNITY): Payer: Medicare Other

## 2014-10-20 DIAGNOSIS — N183 Chronic kidney disease, stage 3 unspecified: Secondary | ICD-10-CM | POA: Diagnosis present

## 2014-10-20 DIAGNOSIS — E119 Type 2 diabetes mellitus without complications: Secondary | ICD-10-CM | POA: Diagnosis present

## 2014-10-20 DIAGNOSIS — R768 Other specified abnormal immunological findings in serum: Secondary | ICD-10-CM | POA: Diagnosis present

## 2014-10-20 DIAGNOSIS — R894 Abnormal immunological findings in specimens from other organs, systems and tissues: Secondary | ICD-10-CM

## 2014-10-20 DIAGNOSIS — J810 Acute pulmonary edema: Secondary | ICD-10-CM | POA: Diagnosis present

## 2014-10-20 DIAGNOSIS — Z8639 Personal history of other endocrine, nutritional and metabolic disease: Secondary | ICD-10-CM

## 2014-10-20 DIAGNOSIS — I248 Other forms of acute ischemic heart disease: Secondary | ICD-10-CM | POA: Diagnosis present

## 2014-10-20 DIAGNOSIS — I10 Essential (primary) hypertension: Secondary | ICD-10-CM

## 2014-10-20 LAB — BASIC METABOLIC PANEL
Anion gap: 9 (ref 5–15)
BUN: 30 mg/dL — ABNORMAL HIGH (ref 6–20)
CO2: 18 mmol/L — AB (ref 22–32)
Calcium: 7.8 mg/dL — ABNORMAL LOW (ref 8.9–10.3)
Chloride: 116 mmol/L — ABNORMAL HIGH (ref 101–111)
Creatinine, Ser: 1.78 mg/dL — ABNORMAL HIGH (ref 0.61–1.24)
GFR, EST AFRICAN AMERICAN: 41 mL/min — AB (ref >60)
GFR, EST NON AFRICAN AMERICAN: 35 mL/min — AB (ref >60)
Glucose, Bld: 133 mg/dL — ABNORMAL HIGH (ref 65–99)
POTASSIUM: 3.8 mmol/L (ref 3.5–5.1)
SODIUM: 143 mmol/L (ref 135–145)

## 2014-10-20 LAB — GLUCOSE, CAPILLARY
GLUCOSE-CAPILLARY: 125 mg/dL — AB (ref 65–99)
GLUCOSE-CAPILLARY: 150 mg/dL — AB (ref 65–99)
Glucose-Capillary: 173 mg/dL — ABNORMAL HIGH (ref 65–99)
Glucose-Capillary: 206 mg/dL — ABNORMAL HIGH (ref 65–99)

## 2014-10-20 LAB — BLOOD GAS, ARTERIAL
Acid-base deficit: 3.8 mmol/L — ABNORMAL HIGH (ref 0.0–2.0)
Bicarbonate: 19.4 mEq/L — ABNORMAL LOW (ref 20.0–24.0)
Drawn by: 31814
FIO2: 0.21 %
O2 Saturation: 94.4 %
PATIENT TEMPERATURE: 98.6
TCO2: 20.2 mmol/L (ref 0–100)
pCO2 arterial: 27.5 mmHg — ABNORMAL LOW (ref 35.0–45.0)
pH, Arterial: 7.463 — ABNORMAL HIGH (ref 7.350–7.450)
pO2, Arterial: 68.1 mmHg — ABNORMAL LOW (ref 80.0–100.0)

## 2014-10-20 LAB — HERPES SIMPLEX VIRUS(HSV) DNA BY PCR
HSV 1 DNA: NEGATIVE
HSV 2 DNA: NEGATIVE

## 2014-10-20 LAB — CBC
HEMATOCRIT: 29.1 % — AB (ref 39.0–52.0)
HEMOGLOBIN: 9.2 g/dL — AB (ref 13.0–17.0)
MCH: 27.5 pg (ref 26.0–34.0)
MCHC: 31.6 g/dL (ref 30.0–36.0)
MCV: 87.1 fL (ref 78.0–100.0)
Platelets: 78 10*3/uL — ABNORMAL LOW (ref 150–400)
RBC: 3.34 MIL/uL — ABNORMAL LOW (ref 4.22–5.81)
RDW: 19.3 % — ABNORMAL HIGH (ref 11.5–15.5)
WBC: 7.3 10*3/uL (ref 4.0–10.5)

## 2014-10-20 LAB — VDRL, CSF: VDRL Quant, CSF: NONREACTIVE

## 2014-10-20 MED ORDER — QUETIAPINE FUMARATE 100 MG PO TABS
100.0000 mg | ORAL_TABLET | Freq: Every day | ORAL | Status: DC
  Administered 2014-10-20: 100 mg via ORAL
  Filled 2014-10-20 (×2): qty 1

## 2014-10-20 MED ORDER — FUROSEMIDE 10 MG/ML IJ SOLN
60.0000 mg | Freq: Two times a day (BID) | INTRAMUSCULAR | Status: DC
  Administered 2014-10-20 – 2014-10-21 (×3): 60 mg via INTRAVENOUS
  Filled 2014-10-20 (×4): qty 6

## 2014-10-20 NOTE — Progress Notes (Signed)
Pt arrived from . Wife at bedside. Pt oriented to unit and routine. Call bell within reach. Sitter at bedside for safety.

## 2014-10-20 NOTE — Progress Notes (Signed)
Physical Therapy Treatment Patient Details Name: Cody RelicJohn W Schmitt MRN: [id number] DOB: 06/21/1934 Today's Date: 10/20/2014    History of Present Illness Adm with AMS, sepsis (?aspiration pna); MRI and LP negative; 5/5-5/8 intubated; +MI PMHx- DM, hepatitis C    PT Comments    Pt progressing slowly towards physical therapy goals. Was not able to achieve full standing today, however feel this mostly related to confusion. Pt very distracted by objects in the room as well as pockets on therapist's scrubs and it was difficult to complete tasks. Will continue to follow and progress as able per POC.   Follow Up Recommendations  CIR     Equipment Recommendations  Other (comment) (TBA when full history obtained (? has RW))    Recommendations for Other Services       Precautions / Restrictions Precautions Precautions: Fall Restrictions Weight Bearing Restrictions: No    Mobility  Bed Mobility Overal bed mobility: Needs Assistance Bed Mobility: Rolling;Sidelying to Sit;Sit to Sidelying Rolling: Min assist Sidelying to sit: Max assist;+2 for physical assistance     Sit to sidelying: Max assist;+2 for physical assistance General bed mobility comments: Pt pleasantly confused and required hand-over-hand assist as well as +2 physical assist to transition to/from EOB.  Transfers Overall transfer level: Needs assistance Equipment used: 2 person hand held assist Transfers: Sit to/from Stand Sit to Stand: Total assist;+2 physical assistance         General transfer comment: Pt was not able to achieve full standing at this time. Pt was distracted by objects in the room, and had difficulty focusing on the goal of standing up. Pt with good anterior lean, however hands-on assist required for safety.  Ambulation/Gait             General Gait Details: Unable at this time.    Stairs            Wheelchair Mobility    Modified Rankin (Stroke Patients Only)       Balance  Overall balance assessment: Needs assistance Sitting-balance support: Feet supported;Bilateral upper extremity supported Sitting balance-Leahy Scale: Poor                              Cognition Arousal/Alertness: Awake/alert Behavior During Therapy: Restless;Impulsive Overall Cognitive Status: No family/caregiver present to determine baseline cognitive functioning                      Exercises      General Comments        Pertinent Vitals/Pain Pain Assessment: No/denies pain    Home Living                      Prior Function            PT Goals (current goals can now be found in the care plan section) Acute Rehab PT Goals Patient Stated Goal: Pt did not state goals during session PT Goal Formulation: With patient Time For Goal Achievement: 11/01/14 Potential to Achieve Goals: Good Progress towards PT goals: Not progressing toward goals - comment    Frequency  Min 3X/week    PT Plan Current plan remains appropriate    Co-evaluation             End of Session Equipment Utilized During Treatment: Gait belt Activity Tolerance: Patient limited by fatigue;Treatment limited secondary to medical complications (Comment) (confusion) Patient left: in bed;with call bell/phone within  reach;with nursing/sitter in room     Time: [phone] PT Time Calculation (min) (ACUTE ONLY): 16 min  Charges:  $Therapeutic Activity: 8-22 mins                    G Codes:      Cody SlipperKirkman, Cody Schmitt 10/20/2014, 1:23 PM   Cody SlipperLaura Akiva Schmitt, PT, DPT Acute Rehabilitation Services Pager: [phone]

## 2014-10-20 NOTE — Progress Notes (Signed)
Rehab admissions - Evaluated for possible admission.  I met with patient and his niece at the bedside.  I left rehab brochures with the niece.  I called patient's wife.  Wife works and does not know who will stay with patient while she works.  Wife will discuss with husband inpatient rehab versus SNF placement.  Noted patient has a sitter at the bedside and patient is confused.  Will follow up again tomorrow with patient and wife to determine most appropriate venue of care for rehab needs.  Call [place] for questions.  [phone]

## 2014-10-20 NOTE — Progress Notes (Signed)
[place] TEAM 1 - Stepdown/ICU TEAM Progress Note  Cody Schmitt ZOX:[id number] DOB: July 19, 1934 DOA: 10/10/2014 PCP: Ignatius Specking., MD  Admit HPI / Brief Narrative: 80 year old WM PMHx  as below, which is significant for HTN, Asthma, Hepatitis C, and DM Type 2.  Wife states he has had trouble walking for the past 3 months. He was noted to have altered mental status 5/1. He was taken to [facility] ED via EMS and was agitated requiring several doses of ativan en route. In ED he was noted to be febrile with elevated WBC meeting SIRS criteria, but no obvious source of infection as CXR and UDS were negative. LP was attempted in ED, however, this was unsuccessful. He remained agitated throughout his ED stay and required frequent doses of ativan. He was transferred to [facility] for ICU admission and further workup.  HPI/Subjective: 5/12  A/O 1 (does not know where, when, why), will obey some commands. Appears to be hallucinating.  Assessment/Plan: Acute respiratory failure/Severe sepsis/ aspiration pneumonia? -Resolved  Pulm edema -Persistent pulmonary edema C/W CHF  -Titrate O2 to maintain SPO2 >93%  -Continue brovana, pulmicort and prn albuterol -Strict in and out since admission; + 9.1 L  Obstructive sleep apnea -C Pap per respiratory  HTN/tachycardia -Relatively stable  NSTEMI vs demand ischemia -On admission patient with positive troponin, but only mild structural abnormalities, EKG from 5/2 nonspecific ST-T wave abnormality -Continue ASA 81 mg daily -Lasix 60 mg BID  CKD stage 3/fluid overload -Anasarca, patient clearly fluid overloaded  -  Hep C/Abn liver US -Acute hepatitis panel pending -HSV-1 and HSV-2 negative, VDRL negative  Anemia without acute blood loss   Thrombocytopenia, suspect chronic   DM 2, adequately controlled -Moderate SSI  -A1c pending -Lipid panel pending  Acute encephalopathy, unclear etiology  -Patient not on Seroquel prior to admission, DC a.m.  dose, decrease QHS dose to 100 mg may be contributing to patient's encephalopathy    Code Status: FULL Family Communication: no family present at time of exam Disposition Plan: SNF    Consultants: Leigh Aurora (PCCM) Dr.McNeill Windy Fast (neurology)   Procedure/Significant Events: 5/2 Transfer from [facility] 5/2CT head  > no acute intracranial issues 5/4 Neuro consulted 5/3 RUQ [place] > cholelithiasis, diffuse coarsening of hepatic parenchymal structure 5/4 Echocardiogram  > mild -moderateLVH. EF 55 to 60%, PAS 39 mmHg 5/5 EEG >> generalized slowing 5/5 MRI brain  >> normal 5/5 intubated 5/6 LP  >> glucose 83, protein 133, RBC 251, WBC 1 5/6 Doppler left leg  >> no DVT 5/6 off pressors 5/08 extubated 5/09 Very agitated with continuous Precedex reqt. Failed SLP swallow eval. NPO recommended 5/10 More alert. Remains poorly oriented. Quetiapine initiated in effort to wean off dexmedetomidine 5/11 agitation improved. Tolerated DC of dexmedetomidine. Lasix and KCl ordered for severe volume overload 5/12 PCXR;Persistent cardiomegaly with bilateral pulmonary infiltrates particularly throughout Rt lung c/w CHF -small pleural effusions    Culture 5/2 MRSA by PCR negative 5/5 endotracheal positive rare Candida albicans 5/6 C. difficile by PCR negative 5/6 CSF negative 5/12 urine pending  Antibiotics: Vanc 5/02 >> 5/09 Cefepime 5/02 >> 5/10   DVT prophylaxis: Subcutaneous heparin   Devices    LINES / TUBES:  Rt [place] CVL 5/02 >> 5/10    Continuous Infusions:   Objective: VITAL SIGNS: Temp: 98.6 F (37 C) (05/12 1935) Temp Source: Oral (05/12 1935) BP: 126/88 mmHg (05/12 1935) Pulse Rate: 121 (05/12 1935) SPO2; FIO2:   Intake/Output Summary (Last 24  hours) at 10/20/14 2240 Last data filed at 10/20/14 1937  Gross per 24 hour  Intake    470 ml  Output    650 ml  Net   -180 ml     Exam: General:  A/O 1 (does not know where, when, why), will obey  some commands, No acute respiratory distress Lungs: decreased air movement diffusely  Rt>>Lt , wheezes or crackles Cardiovascular: Tachycardic, Regular rhythm without murmur gallop or rub normal S1 and S2 Abdomen: Morbidly obese (anasarca?), Nontender, nondistended, soft, bowel sounds positive, no rebound, no ascites, no appreciable mass Extremities: No significant cyanosis, clubbing. Bilateral lower extremity edema to the hips 2-3+ pitting edema,     Data Reviewed: Basic Metabolic Panel:  Recent Labs Lab 10/14/14 0110  10/15/14 0311 10/16/14 0348 10/17/14 0556 10/18/14 0300 10/19/14 0214 10/20/14 0234  NA  --   < > 147* 150* 150* 148* 146* 143  K  --   < > 3.1* 3.9 3.8 3.7 3.5 3.8  CL  --   < > 122* 123* 122* 121* 117* 116*  CO2  --   < > 21* 21* 22 23 21* 18*  GLUCOSE  --   < > 159* 195* 135* 150* 156* 133*  BUN  --   < > 33* 36* 33* 29* 28* 30*  CREATININE  --   < > 1.07 1.10 1.21 1.34* 1.38* 1.78*  CALCIUM  --   < > 8.2* 8.4* 8.5* 8.5* 8.1* 7.8*  MG 2.1  --  2.1 2.0  --   --   --   --   PHOS 3.3  --  3.6  --   --   --   --   --   < > = values in this interval not displayed. Liver Function Tests:  Recent Labs Lab 10/14/14 0622 10/16/14 0348 10/17/14 0556 10/18/14 0300  AST 44* 46* 51* 81*  ALT 35 37 38 50  ALKPHOS 95 133* 116 112  BILITOT 1.1 0.9 1.1 1.3*  PROT 5.1* 5.0* 5.2* 5.3*  ALBUMIN 1.9* 1.7* 1.7* 1.8*   No results for input(s): LIPASE, AMYLASE in the last 168 hours. No results for input(s): AMMONIA in the last 168 hours. CBC:  Recent Labs Lab 10/16/14 0348 10/17/14 0556 10/18/14 0300 10/19/14 0214 10/20/14 0234  WBC 6.6 7.0 6.4 5.7 7.3  HGB 9.6* 9.6* 9.7* 8.7* 9.2*  HCT 30.6* 30.2* 31.2* 28.0* 29.1*  MCV 89.0 88.3 87.2 85.6 87.1  PLT 90* 89* 94* 75* 78*   Cardiac Enzymes: No results for input(s): CKTOTAL, CKMB, CKMBINDEX, TROPONINI in the last 168 hours. BNP (last 3 results)  Recent Labs  10/10/14 1930  BNP 287.5*    ProBNP (last 3  results) No results for input(s): PROBNP in the last 8760 hours.  CBG:  Recent Labs Lab 10/19/14 1718 10/19/14 2153 10/20/14 0758 10/20/14 1133 10/20/14 1607  GLUCAP 128* 129* 125* 150* 173*    Recent Results (from the past 240 hour(s))  Culture, respiratory (NON-Expectorated)     Status: None   Collection Time: 10/13/14  4:00 PM  Result Value Ref Range Status   Specimen Description ENDOTRACHEAL  Final   Special Requests NONE  Final   Gram Stain   Final    RARE WBC PRESENT, PREDOMINANTLY MONONUCLEAR RARE SQUAMOUS EPITHELIAL CELLS PRESENT NO ORGANISMS SEEN Performed at [facility]    Culture   Final    RARE CANDIDA ALBICANS Performed at [facility]    Report  Status 10/15/2014 FINAL  Final  Clostridium Difficile by PCR     Status: None   Collection Time: 10/14/14  4:30 AM  Result Value Ref Range Status   C difficile by pcr NEGATIVE NEGATIVE Final  Gram stain     Status: None   Collection Time: 10/14/14  1:42 PM  Result Value Ref Range Status   Specimen Description CSF  Final   Special Requests NO2  Final   Gram Stain   Final    CYTOSPIN PREP WBC PRESENT, PREDOMINANTLY MONONUCLEAR NO ORGANISMS SEEN    Report Status 10/14/2014 FINAL  Final  CSF culture     Status: None   Collection Time: 10/14/14  1:42 PM  Result Value Ref Range Status   Specimen Description FLUID CSF  Final   Special Requests NO2  Final   Gram Stain   Final    NO WBC SEEN NO ORGANISMS SEEN CYTOSPIN Performed at [facility]    Culture   Final    NO GROWTH 3 DAYS Performed at [facility]    Report Status 10/18/2014 FINAL  Final     Studies:  Recent x-ray studies have been reviewed in detail by the Attending Physician  Scheduled Meds:  Scheduled Meds: . antiseptic oral rinse  7 mL Mouth Rinse BID  . arformoterol  15 mcg Nebulization BID  . aspirin EC  81 mg Oral Daily  . budesonide  0.25 mg Nebulization BID  . furosemide  60 mg Intravenous BID    . heparin  5,000 Units Subcutaneous 3 times per day  . insulin aspart  0-15 Units Subcutaneous TID WC  . insulin aspart  0-5 Units Subcutaneous QHS  . pantoprazole  40 mg Oral Q1200  . QUEtiapine  100 mg Oral QHS    Time spent on care of this patient: 40 mins   Dian Minahan, Roselind MessierURTIS J , MD  [facility] Office  [phone] Pager [phone]  On-Call/Text Page:      [email or web address]      password [email or web address]  If 7PM-7AM, please contact night-coverage [email or web address] Password [email or web address] 10/20/2014, 10:40 PM   LOS: 10 days   Care during the described time interval was provided by me .  I have reviewed this patient's available data, including medical history, events of note, physical examination, radiology studies and test results as part of my evaluation  Carolyne Littlesurtis Quayshaun Hubbert, MD [phone] Pager

## 2014-10-21 DIAGNOSIS — G4733 Obstructive sleep apnea (adult) (pediatric): Secondary | ICD-10-CM

## 2014-10-21 LAB — COMPREHENSIVE METABOLIC PANEL
ALT: 54 U/L (ref 17–63)
AST: 70 U/L — ABNORMAL HIGH (ref 15–41)
Albumin: 1.8 g/dL — ABNORMAL LOW (ref 3.5–5.0)
Alkaline Phosphatase: 120 U/L (ref 38–126)
Anion gap: 10 (ref 5–15)
BILIRUBIN TOTAL: 1.2 mg/dL (ref 0.3–1.2)
BUN: 32 mg/dL — AB (ref 6–20)
CALCIUM: 7.8 mg/dL — AB (ref 8.9–10.3)
CO2: 20 mmol/L — ABNORMAL LOW (ref 22–32)
Chloride: 111 mmol/L (ref 101–111)
Creatinine, Ser: 1.87 mg/dL — ABNORMAL HIGH (ref 0.61–1.24)
GFR calc non Af Amer: 33 mL/min — ABNORMAL LOW (ref >60)
GFR, EST AFRICAN AMERICAN: 38 mL/min — AB (ref >60)
GLUCOSE: 157 mg/dL — AB (ref 65–99)
Potassium: 3.8 mmol/L (ref 3.5–5.1)
SODIUM: 141 mmol/L (ref 135–145)
Total Protein: 5.2 g/dL — ABNORMAL LOW (ref 6.5–8.1)

## 2014-10-21 LAB — MAGNESIUM: MAGNESIUM: 1.8 mg/dL (ref 1.7–2.4)

## 2014-10-21 LAB — GLUCOSE, CAPILLARY
GLUCOSE-CAPILLARY: 149 mg/dL — AB (ref 65–99)
GLUCOSE-CAPILLARY: 137 mg/dL — AB (ref 65–99)
Glucose-Capillary: 123 mg/dL — ABNORMAL HIGH (ref 65–99)
Glucose-Capillary: 137 mg/dL — ABNORMAL HIGH (ref 65–99)

## 2014-10-21 LAB — AMMONIA: Ammonia: 75 umol/L — ABNORMAL HIGH (ref 9–35)

## 2014-10-21 LAB — HEPATITIS PANEL, ACUTE
HCV Ab: NEGATIVE
HEP B C IGM: NONREACTIVE
Hep A IgM: NONREACTIVE
Hepatitis B Surface Ag: NEGATIVE

## 2014-10-21 MED ORDER — HALOPERIDOL LACTATE 5 MG/ML IJ SOLN: 1.0000 mg | Freq: Four times a day (QID) | INTRAMUSCULAR | Status: DC | PRN

## 2014-10-21 MED ORDER — QUETIAPINE FUMARATE 50 MG PO TABS
50.0000 mg | ORAL_TABLET | Freq: Every day | ORAL | Status: DC
  Administered 2014-10-21: 50 mg via ORAL
  Filled 2014-10-21 (×2): qty 1

## 2014-10-21 NOTE — Progress Notes (Signed)
Physical Therapy Treatment Patient Details Name: Cody RelicJohn W Petrucelli MRN: [id number] DOB: 08/27/1934 Today's Date: 10/21/2014    History of Present Illness Adm with AMS, sepsis (?aspiration pna); MRI and LP negative; 5/5-5/8 intubated; +MI PMHx- DM, hepatitis C    PT Comments    Pt slow to progress mainly due to poor mentation, inability to focus on task, impulsiveness.  With some improvement with mentation, he should be able to participate well with mobility and gait.   Follow Up Recommendations  CIR     Equipment Recommendations  Other (comment)    Recommendations for Other Services       Precautions / Restrictions Precautions Precautions: Fall    Mobility  Bed Mobility Overal bed mobility: Needs Assistance Bed Mobility: Supine to Sit;Sit to Supine Rolling: Min assist   Supine to sit: Max assist;+2 for physical assistance;+2 for safety/equipment Sit to supine: Max assist;+2 for physical assistance;+2 for safety/equipment   General bed mobility comments: pt unable to focus on task for even seconds.  Unable to follow commands and so unsafe that we had to physically guide him through all tasks.  Transfers Overall transfer level: Needs assistance Equipment used: 2 person hand held assist Transfers: Sit to/from [employer]Stand;Stand Pivot Transfers Sit to Stand: Mod assist;Max assist;+2 physical assistance Stand pivot transfers:  (aborted transfer due to safety issues )       General transfer comment: pt unable to focus on task, needs physical guiding.  Unsafe at times with 2 person assist due to his distractability and impulsiveness.  Ambulation/Gait                 Stairs            Wheelchair Mobility    Modified Rankin (Stroke Patients Only)       Balance Overall balance assessment: Needs assistance Sitting-balance support: No upper extremity supported;Single extremity supported;Feet supported Sitting balance-Leahy Scale: Poor Sitting balance - Comments:  Might be able to balance well if not so distractable and impulsive.     Standing balance-Leahy Scale: Poor Standing balance comment: standing at EOB with +2 max and pt unable to get a good stance  (either too wide or narrow)                    Cognition Arousal/Alertness: Awake/alert Behavior During Therapy: Impulsive;Restless Overall Cognitive Status: No family/caregiver present to determine baseline cognitive functioning (But pt very impaired)                      Exercises      General Comments        Pertinent Vitals/Pain Pain Assessment: Faces Faces Pain Scale: No hurt    Home Living                      Prior Function            PT Goals (current goals can now be found in the care plan section) Acute Rehab PT Goals Patient Stated Goal: Pt did not state goals during session PT Goal Formulation: With patient Time For Goal Achievement: 11/01/14 Potential to Achieve Goals: Good Progress towards PT goals: Not progressing toward goals - comment    Frequency  Min 3X/week    PT Plan Current plan remains appropriate    Co-evaluation             End of Session Equipment Utilized During Treatment: Gait belt Activity Tolerance: Patient limited by  fatigue;Treatment limited secondary to medical complications (Comment) Patient left: in bed;with call bell/phone within reach;with nursing/sitter in room     Time: [id number] PT Time Calculation (min) (ACUTE ONLY): 26 min  Charges:  $Therapeutic Activity: 23-37 mins                    G Codes:      Danne Scardina, Eliseo GumKenneth V 10/21/2014, 4:02 PM  10/21/2014  [place] BingKen Nivan Melendrez, PT [phone]  (pager)

## 2014-10-21 NOTE — Progress Notes (Signed)
Placed patient on CPAP via FFM, auto settings (5-20) cm H20, 21% FIO2.  Patient tolerating well at this time.

## 2014-10-21 NOTE — Progress Notes (Signed)
Rehab admissions - I am following pt's case and met with him this am. Pt was very confused, unable to clearly finish his sentences/rambling and was somewhat restless in bed. Safety sitter was present.  I then called and spoke with pt's wife. I explained that we will continue to follow his progress and that rehab MD thought that "pt would still need substantial care after CIR." I had further discussion with her that rehab MD projected that pt would likely need minimal assistance after a possible rehab stay. Pt's wife works from 6:30 am to 5 pm and stated she cannot provide 24 hr care. I asked if other family/friends might be able to help and she did not elaborate.  I explained that SNF may need to be considered in light of decreased caregiver support. Pt's wife wanted to think about her options and I will follow up with pt and his wife on Monday.  I called and updated Dysheka, Education officer, museum.   Thanks.  Nanetta Batty, PT Rehabilitation Admissions Coordinator [phone]

## 2014-10-21 NOTE — Progress Notes (Signed)
[facility] TEAM 1 - Stepdown/ICU TEAM Progress Note  Cody Schmitt ZOX:[id number] DOB: February 05, 1935 DOA: 10/10/2014 PCP: Ignatius Specking., MD  Admit HPI / Brief Narrative: 80 year old M Hx significant for HTN, Asthma, Hepatitis C, and DM Type 2 who had trouble walking for 3 months and then was noted to have altered mental status on 5/1. He was taken to [facility] ED via EMS where he was agitated and required several doses of ativan en route. In the ED he was noted to be febrile with elevated WBC meeting SIRS criteria, but no obvious source of infection as CXR and UDS were negative. LP in ED was unsuccessful. He remained agitated throughout his ED stay and required frequent doses of ativan. He was transferred to [facility] for ICU admission and further workup.  Significant Events: 5/2 Transfer from [facility] 5/2 CT head  > no acute intracranial issues 5/4 Neuro consulted 5/3 RUQ [place] > cholelithiasis, diffuse coarsening of hepatic parenchymal structure 5/4 Echocardiogram  > mild -moderateLVH. EF 55 to 60%, PAS 39 mmHg 5/5 EEG > generalized slowing 5/5 MRI brain  > normal 5/5 intubated 5/6 LP  > glucose 83, protein 133, RBC 251, WBC 1 5/6 Doppler left leg  > no DVT 5/6 off pressors 5/08 extubated 5/09 Very agitated with continuous Precedex - failed SLP swallow eval. NPO  5/10 More alert. Remains poorly oriented. Quetiapine initiated in effort to wean off dexmedetomidine 5/11 agitation improved. Tolerated DC of dexmedetomidine. Lasix and KCl ordered for severe volume overload 5/12 PCXR;Persistent cardiomegaly with bilateral pulmonary infiltrates particularly throughout Rt lung c/w CHF -small pleural effusions   HPI/Subjective: The pt is alert/awake, but his speech is garbled and unintelligible.  He does not appear to be oriented.  He can not provide a reliable hx or ROS.    Assessment/Plan:  Acute encephalopathy, unclear etiology  -Patient not on Seroquel prior to admission therefore current standing  doses being tapered off - extensive evaluation thus far w/o clear etiology - per Neuro note of 5/11 this is an improvement - will cont to follow and decrease sedatives   Acute respiratory failure / Severe sepsis / aspiration pneumonia? -resolved  Pulm edema -ongoing diuresis now being limited by climbing cr - now net +7L since admit down from ~8 - follow Is/Os   Obstructive sleep apnea -C Pap QHS   HTN -BP currently well controlled   NSTEMI vs demand ischemia -hemodynamically stable - troponin appears to have peaked at 0.17 -Continue ASA 81 mg daily  CKD stage 3 w/ fluid overload  -stop diuretic and follow crt trend - crt climbing steadily   Hep C? / Abn liver US -Acute hepatitis panel negative   Anemia without acute blood loss -Hgb stable - follow  Thrombocytopenia suspect chronic  DM 2 -CBG reasonably controlled   Code Status: FULL Family Communication: no family present at time of exam Disposition Plan: SNF  Consultants: Neurology Critical care  Antibiotics: Vanc 5/02 >> 5/09 Cefepime 5/02 >> 5/10  DVT prophylaxis: Subcutaneous heparin  Objective: Blood pressure 127/57, pulse 106, temperature 99.4 F (37.4 C), temperature source Oral, resp. rate 30, height  (1.778 m), weight 128.8 kg (283 lb 15.2 oz), SpO2 94 %.  Intake/Output Summary (Last 24 hours) at 10/21/14 1606 Last data filed at 10/21/14 1349  Gross per 24 hour  Intake    480 ml  Output   2625 ml  Net  -2145 ml   Exam: General:  No acute respiratory distress - alert  but confused - noncommunicative  Lungs: no wheeze or focal crackles Cardiovascular: RRR w/o gallup or rub  Abdomen: Nontender, protuberent, soft, bowel sounds positive, no rebound, no ascites, no appreciable mass Extremities: No significant cyanosis, clubbing;  Bilateral lower extremity edema to the hips 3+   Data Reviewed: Basic Metabolic Panel:  Recent Labs Lab 10/15/14 0311 10/16/14 0348 10/17/14 0556  10/18/14 0300 10/19/14 0214 10/20/14 0234 10/21/14 0222  NA 147* 150* 150* 148* 146* 143 141  K 3.1* 3.9 3.8 3.7 3.5 3.8 3.8  CL 122* 123* 122* 121* 117* 116* 111  CO2 21* 21* 22 23 21* 18* 20*  GLUCOSE 159* 195* 135* 150* 156* 133* 157*  BUN 33* 36* 33* 29* 28* 30* 32*  CREATININE 1.07 1.10 1.21 1.34* 1.38* 1.78* 1.87*  CALCIUM 8.2* 8.4* 8.5* 8.5* 8.1* 7.8* 7.8*  MG 2.1 2.0  --   --   --   --  1.8  PHOS 3.6  --   --   --   --   --   --    Liver Function Tests:  Recent Labs Lab 10/16/14 0348 10/17/14 0556 10/18/14 0300 10/21/14 0222  AST 46* 51* 81* 70*  ALT 37 38 50 54  ALKPHOS 133* 116 112 120  BILITOT 0.9 1.1 1.3* 1.2  PROT 5.0* 5.2* 5.3* 5.2*  ALBUMIN 1.7* 1.7* 1.8* 1.8*    Recent Labs Lab 10/20/14 2305  AMMONIA 75*   CBC:  Recent Labs Lab 10/16/14 0348 10/17/14 0556 10/18/14 0300 10/19/14 0214 10/20/14 0234  WBC 6.6 7.0 6.4 5.7 7.3  HGB 9.6* 9.6* 9.7* 8.7* 9.2*  HCT 30.6* 30.2* 31.2* 28.0* 29.1*  MCV 89.0 88.3 87.2 85.6 87.1  PLT 90* 89* 94* 75* 78*   CBG:  Recent Labs Lab 10/20/14 1133 10/20/14 1607 10/20/14 2240 10/21/14 0807 10/21/14 1152  GLUCAP 150* 173* 206* 123* 149*    Recent Results (from the past 240 hour(s))  Culture, respiratory (NON-Expectorated)     Status: None   Collection Time: 10/13/14  4:00 PM  Result Value Ref Range Status   Specimen Description ENDOTRACHEAL  Final   Special Requests NONE  Final   Gram Stain   Final    RARE WBC PRESENT, PREDOMINANTLY MONONUCLEAR RARE SQUAMOUS EPITHELIAL CELLS PRESENT NO ORGANISMS SEEN Performed at [facility]    Culture   Final    RARE CANDIDA ALBICANS Performed at [facility]    Report Status 10/15/2014 FINAL  Final  Clostridium Difficile by PCR     Status: None   Collection Time: 10/14/14  4:30 AM  Result Value Ref Range Status   C difficile by pcr NEGATIVE NEGATIVE Final  Gram stain     Status: None   Collection Time: 10/14/14  1:42 PM  Result Value  Ref Range Status   Specimen Description CSF  Final   Special Requests NO2  Final   Gram Stain   Final    CYTOSPIN PREP WBC PRESENT, PREDOMINANTLY MONONUCLEAR NO ORGANISMS SEEN    Report Status 10/14/2014 FINAL  Final  CSF culture     Status: None   Collection Time: 10/14/14  1:42 PM  Result Value Ref Range Status   Specimen Description FLUID CSF  Final   Special Requests NO2  Final   Gram Stain   Final    NO WBC SEEN NO ORGANISMS SEEN CYTOSPIN Performed at [facility]    Culture   Final    NO GROWTH 3 DAYS Performed  at [facility]    Report Status 10/18/2014 FINAL  Final     Studies:  Recent x-ray studies have been reviewed in detail by the Attending Physician  Scheduled Meds:  Scheduled Meds: . antiseptic oral rinse  7 mL Mouth Rinse BID  . arformoterol  15 mcg Nebulization BID  . aspirin EC  81 mg Oral Daily  . budesonide  0.25 mg Nebulization BID  . furosemide  60 mg Intravenous BID  . heparin  5,000 Units Subcutaneous 3 times per day  . insulin aspart  0-15 Units Subcutaneous TID WC  . insulin aspart  0-5 Units Subcutaneous QHS  . pantoprazole  40 mg Oral Q1200  . QUEtiapine  100 mg Oral QHS    Time spent on care of this patient: 35 mins  Lonia BloodJeffrey T. Gisella Alwine, MD [facility] For Consults/Admissions - Flow Manager - [phone] Office  [phone]  Contact MD directly via text page:      [email or web address]      password [email or web address]  10/21/2014, 4:06 PM   LOS: 11 days

## 2014-10-21 NOTE — Clinical Social Work Placement (Signed)
   CLINICAL SOCIAL WORK PLACEMENT  NOTE  Date:  10/21/2014  Patient Details  Name: Cody RelicJohn W Mcdaris MRN: [id number] Date of Birth: 11/17/1934  Clinical Social Work is seeking post-discharge placement for this patient at the Skilled  Nursing Facility level of care (*CSW will initial, date and re-position this form in  chart as items are completed):  Yes   Patient/family provided with [facility] Clinical Social Work Department's list of facilities offering this level of care within the geographic area requested by the patient (or if unable, by the patient's family).  Yes   Patient/family informed of their freedom to choose among providers that offer the needed level of care, that participate in Medicare, Medicaid or managed care program needed by the patient, have an available bed and are willing to accept the patient.  Yes   Patient/family informed of 's ownership interest [facility] and [facility], as well as of the fact that they are under no obligation to receive care at these facilities.  PASRR submitted to EDS on 10/21/14     PASRR number received on 10/21/14     Existing PASRR number confirmed on       FL2 transmitted to all facilities in geographic area requested by pt/family on       FL2 transmitted to all facilities within larger geographic area on       Patient informed that his/her managed care company has contracts with or will negotiate with certain facilities, including the following:            Patient/family informed of bed offers received.  Patient chooses bed at       Physician recommends and patient chooses bed at      Patient to be transferred to   on  .  Patient to be transferred to facility by       Patient family notified on   of transfer.  Name of family member notified:        PHYSICIAN       Additional Comment:    _______________________________________________ Gwynne EdingerBibbs, Jennyfer Nickolson, LCSW 10/21/2014, 4:09 PM

## 2014-10-21 NOTE — Clinical Social Work Note (Signed)
Clinical Social Work Assessment  Patient Details  Name: Cody Schmitt MRN: [id number] Date of Birth: Jan 16, 1935  Date of referral:  10/21/14               Reason for consult:  Facility Placement                Housing/Transportation Living arrangements for the past 2 months:  Single Family Home Source of Information:  Spouse Patient Interpreter Needed:  None Criminal Activity/Legal Involvement Pertinent to Current Situation/Hospitalization:  No - Comment as needed Significant Relationships:  Spouse Lives with:  Spouse Do you feel safe going back to the place where you live?  No Need for family participation in patient care:  Yes (Comment)  Care giving concerns:  Pt's wife Rollene Fare reported she work from 6:30am to 5pm thus she can not provided 24 hour care.    Social Worker assessment / plan:  CSW met the pt's wife Rollene Fare the bedside. CSW introduced self and purpose of the visit. CSW discussed SNF rehab. CSW explained the SNF process. CSW explained insurance and its relation to SNF placement. CSW discussed geographic location in which the she would like to receive rehab.  Rollene Fare inquired about SNF in [place]. CSW will continue to follow this pt and assist with discharge as needed.   Patient/Family's Response to care:  Rollene Fare reported the care in which the is receving has been wonderful.   Patient/Family's Understanding of and Emotional Response to Diagnosis, Current Treatment, and Prognosis:  Rollene Fare become tearful. Rollene Fare reported a decline in the pt's health over the last few month. Rollene Fare reported that she pray that the pt does not develop new diagnosis.   Emotional Assessment Appearance:  Sleep  Attitude/Demeanor/Rapport:  N/A  Affect (typically observed):  Sleep Orientation:   (Pt is disoriented ) Alcohol / Substance use:  Not Applicable Psych involvement (Current and /or in the community):  No (Comment)  Discharge Needs  Concerns to be addressed:  Denies Needs/Concerns  at this time Barriers to Discharge:  No Barriers Identified  [place], MSW, [place]

## 2014-10-21 NOTE — Progress Notes (Signed)
RT placed pt on auto titrate CPAP on room air via ffm. Pt is resting comfortably and tolerating CPAP well at this time. RT will continue to monitor as needed.

## 2014-10-21 NOTE — Progress Notes (Signed)
Medicare Important Message given? YES (If response is "NO", the following Medicare IM given date fields will be blank) Date Medicare IM given: 10/21/14 Medicare IM given by: Wylie Coon 

## 2014-10-22 DIAGNOSIS — K7290 Hepatic failure, unspecified without coma: Secondary | ICD-10-CM

## 2014-10-22 LAB — URINE CULTURE

## 2014-10-22 LAB — COMPREHENSIVE METABOLIC PANEL
ALK PHOS: 115 U/L (ref 38–126)
ALT: 51 U/L (ref 17–63)
AST: 72 U/L — AB (ref 15–41)
Albumin: 1.8 g/dL — ABNORMAL LOW (ref 3.5–5.0)
Anion gap: 9 (ref 5–15)
BUN: 31 mg/dL — ABNORMAL HIGH (ref 6–20)
CO2: 23 mmol/L (ref 22–32)
Calcium: 7.9 mg/dL — ABNORMAL LOW (ref 8.9–10.3)
Chloride: 110 mmol/L (ref 101–111)
Creatinine, Ser: 1.82 mg/dL — ABNORMAL HIGH (ref 0.61–1.24)
GFR calc Af Amer: 40 mL/min — ABNORMAL LOW (ref >60)
GFR calc non Af Amer: 34 mL/min — ABNORMAL LOW (ref >60)
GLUCOSE: 123 mg/dL — AB (ref 65–99)
Potassium: 3.5 mmol/L (ref 3.5–5.1)
SODIUM: 142 mmol/L (ref 135–145)
Total Bilirubin: 1.2 mg/dL (ref 0.3–1.2)
Total Protein: 5.9 g/dL — ABNORMAL LOW (ref 6.5–8.1)

## 2014-10-22 LAB — CBC
HCT: 27.3 % — ABNORMAL LOW (ref 39.0–52.0)
HEMOGLOBIN: 8.6 g/dL — AB (ref 13.0–17.0)
MCH: 27.4 pg (ref 26.0–34.0)
MCHC: 31.5 g/dL (ref 30.0–36.0)
MCV: 86.9 fL (ref 78.0–100.0)
Platelets: 71 10*3/uL — ABNORMAL LOW (ref 150–400)
RBC: 3.14 MIL/uL — AB (ref 4.22–5.81)
RDW: 19.9 % — ABNORMAL HIGH (ref 11.5–15.5)
WBC: 7.8 10*3/uL (ref 4.0–10.5)

## 2014-10-22 LAB — GLUCOSE, CAPILLARY
GLUCOSE-CAPILLARY: 142 mg/dL — AB (ref 65–99)
GLUCOSE-CAPILLARY: 157 mg/dL — AB (ref 65–99)
GLUCOSE-CAPILLARY: 136 mg/dL — AB (ref 65–99)
Glucose-Capillary: 140 mg/dL — ABNORMAL HIGH (ref 65–99)

## 2014-10-22 LAB — MAGNESIUM: Magnesium: 1.7 mg/dL (ref 1.7–2.4)

## 2014-10-22 MED ORDER — QUETIAPINE FUMARATE 25 MG PO TABS
25.0000 mg | ORAL_TABLET | Freq: Every day | ORAL | Status: DC
  Administered 2014-10-22: 25 mg via ORAL
  Filled 2014-10-22 (×2): qty 1

## 2014-10-22 MED ORDER — LACTULOSE 10 GM/15ML PO SOLN
30.0000 g | Freq: Three times a day (TID) | ORAL | Status: DC
  Administered 2014-10-22 – 2014-10-25 (×10): 30 g via ORAL
  Filled 2014-10-22 (×13): qty 45

## 2014-10-22 NOTE — Progress Notes (Signed)
Placed patient on CPAP via FFM, auto settings (5-20 cmH20) @ 21% FIO2.  Patient tolerating well at this time. RT will monitor as needed.

## 2014-10-22 NOTE — Progress Notes (Signed)
[place] TEAM 1 - Stepdown/ICU TEAM Progress Note  Cody Schmitt ZOX:[id number] DOB: March 31, 1935 DOA: 10/10/2014 PCP: Ignatius Specking., MD  Admit HPI / Brief Narrative: 80 year old M Hx significant for HTN, Asthma, Hepatitis C, and DM 2 who had trouble walking for 3 months and then was noted to have altered mental status on 5/1. He was taken to [facility] ED via EMS where he was agitated and required several doses of ativan en route. In the ED he was noted to be febrile with elevated WBC meeting SIRS criteria, but no obvious source of infection as CXR and UDS were negative. LP in ED was unsuccessful. He remained agitated throughout his ED stay and required frequent doses of ativan. He was transferred to [facility] for ICU admission and further workup.  Significant Events: 5/2 Transfer from [facility] 5/2 CT head  > no acute intracranial issues 5/4 Neuro consulted 5/3 RUQ [place] > cholelithiasis, diffuse coarsening of hepatic parenchymal structure 5/4 Echocardiogram  > mild -moderateLVH. EF 55 to 60%, PAS 39 mmHg 5/5 EEG > generalized slowing 5/5 MRI brain  > normal 5/5 intubated 5/6 LP  > glucose 83, protein 133, RBC 251, WBC 1 5/6 Doppler left leg  > no DVT 5/6 off pressors 5/08 extubated 5/09 Very agitated with continuous Precedex - failed SLP swallow eval. NPO  5/10 More alert. Remains poorly oriented. Quetiapine initiated in effort to wean off dexmedetomidine 5/11 agitation improved. Tolerated DC of dexmedetomidine. Lasix and KCl ordered for severe volume overload 5/12 PCXR;Persistent cardiomegaly with bilateral pulmonary infiltrates particularly throughout Rt lung c/w CHF -small pleural effusions   HPI/Subjective: The patient is markedly more alert today.  His speech is much more clear.  He does remain confused but to a less severe extent.  He denies chest pain nausea vomiting or abdominal pain.  He does still agree that his legs are both weak.    Assessment/Plan:  Acute encephalopathy, unclear  etiology  -Patient not on Seroquel prior to admission therefore current standing doses being tapered off - extensive evaluation thus far w/o clear etiology - per Neuro note of 5/11 this is an improvement - will cont to follow and decrease sedatives - elevated ammonia level to be treated with lactulose  Acute respiratory failure / Severe sepsis / aspiration pneumonia? -resolved  Pulm edema -ongoing diuresis now being limited by climbing cr - now net +7L since admit down from ~8 - follow Is/Os - clinically the patient is stable with saturations 95% on room air  Obstructive sleep apnea -C Pap QHS   HTN -BP currently reasonably controlled - follow without change today  NSTEMI vs demand ischemia -hemodynamically stable - troponin appears to have peaked at 0.17 -Continue ASA 81 mg daily  CKD stage 3 w/ fluid overload  -stopped diuretic - follow crt trend - crt slightly improved today  Abn liver US -Acute hepatitis panel negative including negative hepatitis C antibody - ammonia level significantly elevated - lactulose initiated  Anemia without acute blood loss -Hgb stable - follow  Thrombocytopenia suspect chronic and related to liver disease - follow  DM 2 -CBG reasonably controlled   Code Status: FULL Family Communication: no family present at time of exam Disposition Plan: SNF  Consultants: Neurology Critical care  Antibiotics: Vanc 5/02 > 5/08 Cefepime 5/02 > 5/10  DVT prophylaxis: Subcutaneous heparin  Objective: Blood pressure 143/48, pulse 96, temperature 98.6 F (37 C), temperature source Oral, resp. rate 20, height  (1.778 m), weight 123.9 kg (273  lb 2.4 oz), SpO2 95 %.  Intake/Output Summary (Last 24 hours) at 10/22/14 1622 Last data filed at 10/22/14 0808  Gross per 24 hour  Intake    480 ml  Output   2325 ml  Net  -1845 ml   Exam: General:  No acute respiratory distress - more alert but mildly confused - able to understand conversation and  participate today Lungs: no wheeze or focal crackles Cardiovascular: RRR w/o gallup or rub  Abdomen: Nontender, markedly protuberent, soft, bowel sounds positive, no rebound, no ascites, no appreciable mass Extremities: No significant cyanosis, clubbing;  B lower extremity edema to the hips 2+   Data Reviewed: Basic Metabolic Panel:  Recent Labs Lab 10/16/14 0348  10/18/14 0300 10/19/14 0214 10/20/14 0234 10/21/14 0222 10/22/14 0228  NA 150*  < > 148* 146* 143 141 142  K 3.9  < > 3.7 3.5 3.8 3.8 3.5  CL 123*  < > 121* 117* 116* 111 110  CO2 21*  < > 23 21* 18* 20* 23  GLUCOSE 195*  < > 150* 156* 133* 157* 123*  BUN 36*  < > 29* 28* 30* 32* 31*  CREATININE 1.10  < > 1.34* 1.38* 1.78* 1.87* 1.82*  CALCIUM 8.4*  < > 8.5* 8.1* 7.8* 7.8* 7.9*  MG 2.0  --   --   --   --  1.8 1.7  < > = values in this interval not displayed.   Liver Function Tests:  Recent Labs Lab 10/16/14 0348 10/17/14 0556 10/18/14 0300 10/21/14 0222 10/22/14 0228  AST 46* 51* 81* 70* 72*  ALT 37 38 50 54 51  ALKPHOS 133* 116 112 120 115  BILITOT 0.9 1.1 1.3* 1.2 1.2  PROT 5.0* 5.2* 5.3* 5.2* 5.9*  ALBUMIN 1.7* 1.7* 1.8* 1.8* 1.8*    Recent Labs Lab 10/20/14 2305  AMMONIA 75*   CBC:  Recent Labs Lab 10/17/14 0556 10/18/14 0300 10/19/14 0214 10/20/14 0234 10/22/14 0228  WBC 7.0 6.4 5.7 7.3 7.8  HGB 9.6* 9.7* 8.7* 9.2* 8.6*  HCT 30.2* 31.2* 28.0* 29.1* 27.3*  MCV 88.3 87.2 85.6 87.1 86.9  PLT 89* 94* 75* 78* 71*   CBG:  Recent Labs Lab 10/21/14 1152 10/21/14 1627 10/21/14 2143 10/22/14 0759 10/22/14 1222  GLUCAP 149* 137* 137* 142* 157*    Recent Results (from the past 240 hour(s))  Culture, respiratory (NON-Expectorated)     Status: None   Collection Time: 10/13/14  4:00 PM  Result Value Ref Range Status   Specimen Description ENDOTRACHEAL  Final   Special Requests NONE  Final   Gram Stain   Final    RARE WBC PRESENT, PREDOMINANTLY MONONUCLEAR RARE SQUAMOUS EPITHELIAL  CELLS PRESENT NO ORGANISMS SEEN Performed at [facility]    Culture   Final    RARE CANDIDA ALBICANS Performed at [facility]    Report Status 10/15/2014 FINAL  Final  Clostridium Difficile by PCR     Status: None   Collection Time: 10/14/14  4:30 AM  Result Value Ref Range Status   C difficile by pcr NEGATIVE NEGATIVE Final  Gram stain     Status: None   Collection Time: 10/14/14  1:42 PM  Result Value Ref Range Status   Specimen Description CSF  Final   Special Requests NO2  Final   Gram Stain   Final    CYTOSPIN PREP WBC PRESENT, PREDOMINANTLY MONONUCLEAR NO ORGANISMS SEEN    Report Status 10/14/2014 FINAL  Final  CSF culture     Status: None   Collection Time: 10/14/14  1:42 PM  Result Value Ref Range Status   Specimen Description FLUID CSF  Final   Special Requests NO2  Final   Gram Stain   Final    NO WBC SEEN NO ORGANISMS SEEN CYTOSPIN Performed at [facility]    Culture   Final    NO GROWTH 3 DAYS Performed at [facility]    Report Status 10/18/2014 FINAL  Final  Culture, Urine     Status: None   Collection Time: 10/21/14  1:59 AM  Result Value Ref Range Status   Specimen Description URINE, RANDOM  Final   Special Requests NONE  Final   Colony Count   Final    45,000 COLONIES/ML Performed at [facility]    Culture YEAST Performed at [facility]   Final   Report Status 10/22/2014 FINAL  Final     Studies:  Recent x-ray studies have been reviewed in detail by the Attending Physician  Scheduled Meds:  Scheduled Meds: . arformoterol  15 mcg Nebulization BID  . aspirin EC  81 mg Oral Daily  . budesonide  0.25 mg Nebulization BID  . heparin  5,000 Units Subcutaneous 3 times per day  . insulin aspart  0-15 Units Subcutaneous TID WC  . insulin aspart  0-5 Units Subcutaneous QHS  . pantoprazole  40 mg Oral Q1200  . QUEtiapine  50 mg Oral QHS    Time spent on care of this patient: 35  mins  Lonia BloodJeffrey T. Kayvion Arneson, MD [facility] For Consults/Admissions - Flow Manager - [phone] Office  [phone]  Contact MD directly via text page:      [email or web address]      password [email or web address]  10/22/2014, 4:22 PM   LOS: 12 days

## 2014-10-23 LAB — CBC
HCT: 27.5 % — ABNORMAL LOW (ref 39.0–52.0)
HEMATOCRIT: 27.4 % — AB (ref 39.0–52.0)
Hemoglobin: 8.6 g/dL — ABNORMAL LOW (ref 13.0–17.0)
Hemoglobin: 8.6 g/dL — ABNORMAL LOW (ref 13.0–17.0)
MCH: 27.1 pg (ref 26.0–34.0)
MCH: 27 pg (ref 26.0–34.0)
MCHC: 31.3 g/dL (ref 30.0–36.0)
MCHC: 31.4 g/dL (ref 30.0–36.0)
MCV: 86.4 fL (ref 78.0–100.0)
MCV: 86.5 fL (ref 78.0–100.0)
PLATELETS: 103 10*3/uL — AB (ref 150–400)
Platelets: 103 10*3/uL — ABNORMAL LOW (ref 150–400)
RBC: 3.18 MIL/uL — AB (ref 4.22–5.81)
RBC: 3.17 MIL/uL — AB (ref 4.22–5.81)
RDW: 19.9 % — ABNORMAL HIGH (ref 11.5–15.5)
RDW: 20 % — ABNORMAL HIGH (ref 11.5–15.5)
WBC: 7.7 10*3/uL (ref 4.0–10.5)
WBC: 7.7 10*3/uL (ref 4.0–10.5)

## 2014-10-23 LAB — COMPREHENSIVE METABOLIC PANEL
ALT: 55 U/L (ref 17–63)
AST: 82 U/L — AB (ref 15–41)
Albumin: 1.9 g/dL — ABNORMAL LOW (ref 3.5–5.0)
Alkaline Phosphatase: 123 U/L (ref 38–126)
Anion gap: 10 (ref 5–15)
BUN: 28 mg/dL — ABNORMAL HIGH (ref 6–20)
CO2: 22 mmol/L (ref 22–32)
Calcium: 7.8 mg/dL — ABNORMAL LOW (ref 8.9–10.3)
Chloride: 108 mmol/L (ref 101–111)
Creatinine, Ser: 1.67 mg/dL — ABNORMAL HIGH (ref 0.61–1.24)
GFR calc Af Amer: 44 mL/min — ABNORMAL LOW (ref >60)
GFR calc non Af Amer: 38 mL/min — ABNORMAL LOW (ref >60)
Glucose, Bld: 137 mg/dL — ABNORMAL HIGH (ref 65–99)
Potassium: 3.6 mmol/L (ref 3.5–5.1)
Sodium: 140 mmol/L (ref 135–145)
TOTAL PROTEIN: 5.7 g/dL — AB (ref 6.5–8.1)
Total Bilirubin: 1.1 mg/dL (ref 0.3–1.2)

## 2014-10-23 LAB — GLUCOSE, CAPILLARY
GLUCOSE-CAPILLARY: 127 mg/dL — AB (ref 65–99)
GLUCOSE-CAPILLARY: 155 mg/dL — AB (ref 65–99)
GLUCOSE-CAPILLARY: 160 mg/dL — AB (ref 65–99)
Glucose-Capillary: 131 mg/dL — ABNORMAL HIGH (ref 65–99)

## 2014-10-23 LAB — PROTIME-INR
INR: 1.39 (ref 0.00–1.49)
Prothrombin Time: 17.2 seconds — ABNORMAL HIGH (ref 11.6–15.2)

## 2014-10-23 LAB — APTT: APTT: 42 s — AB (ref 24–37)

## 2014-10-23 LAB — AMMONIA: AMMONIA: 47 umol/L — AB (ref 9–35)

## 2014-10-23 MED ORDER — FUROSEMIDE 10 MG/ML IJ SOLN
80.0000 mg | Freq: Two times a day (BID) | INTRAMUSCULAR | Status: DC
  Administered 2014-10-23 – 2014-10-25 (×4): 80 mg via INTRAVENOUS
  Filled 2014-10-23 (×8): qty 8

## 2014-10-23 MED ORDER — FUROSEMIDE 80 MG PO TABS
80.0000 mg | ORAL_TABLET | Freq: Two times a day (BID) | ORAL | Status: DC
  Administered 2014-10-24 – 2014-10-25 (×2): 80 mg via ORAL
  Filled 2014-10-23 (×7): qty 1

## 2014-10-23 NOTE — Progress Notes (Signed)
[place] TEAM 1 - Stepdown/ICU TEAM Progress Note  Babette RelicJohn W Lundahl ZOX:[id number] DOB: 03/21/1935 DOA: 10/10/2014 PCP: Ignatius SpeckingVYAS,DHRUV B., MD   Admit HPI / Brief Narrative: 80 year old M Hx significant for HTN, Asthma, Hepatitis C, and DM 2 who had trouble walking for 3 months and then was noted to have altered mental status on 5/1. [facility] where he was agitated and required several doses of ativan en route. In the ED he was noted to be febrile with elevated WBC meeting SIRS criteria, but no obvious source of infection as CXR and UDS were negative. LP in ED was unsuccessful. He remained agitated throughout his ED stay and required frequent doses of ativan. [facility] and further workup.  Significant Events: [facility] head  > no acute intracranial issues 5/4 Neuro consulted 5/3 RUQ US > cholelithiasis, diffuse coarsening of hepatic parenchymal structure 5/4 Echocardiogram  > mild -moderateLVH. EF 55 to 60%, PAS 39 mmHg 5/5 EEG > generalized slowing 5/5 MRI brain  > normal 5/5 intubated 5/6 LP  > glucose 83, protein 133, RBC 251, WBC 1 5/6 Doppler left leg  > no DVT 5/6 off pressors 5/08 extubated 5/09 Very agitated with continuous Precedex - failed SLP swallow eval. NPO  5/10 More alert. Remains poorly oriented. Quetiapine initiated in effort to wean off dexmedetomidine 5/11 agitation improved. Tolerated DC of dexmedetomidine. Lasix and KCl ordered for severe volume overload 5/12 PCXR;Persistent cardiomegaly with bilateral pulmonary infiltrates particularly throughout Rt lung c/w CHF -small pleural effusions   HPI/Subjective: The patient's mental status is improved again today.  He remains somewhat confused but can answer most of my questions.  His wife agrees he has improved.  He has noted increased scrotal edema as well as edema of the lower extremities today with some very superficial irritation of the  scrotum.  He denies chest pain nausea vomiting or abdominal pain.  Assessment/Plan:  Acute encephalopathy, unclear etiology  -Patient not on Seroquel prior to admission therefore has been tapered off - extensive evaluation thus far w/o clear etiology for initial confusion though I am now suspicious of hepatic encephalopathy - continue lactulose and follow clinically  Acute respiratory failure / Severe sepsis / aspiration pneumonia? -resolved  Volume overload -ongoing diuresis now being limited by climbing cr - now net +5L since admit down from ~8 - follow Is/Os - clinically the patient is stable with saturations 95% on room air but does have worsening lower extremity edema and now scrotal and pelvic edema - resume diuretics and follow  Obstructive sleep apnea -CPAP QHS   HTN -BP currently reasonably controlled - follow without change today  NSTEMI vs demand ischemia -hemodynamically stable - troponin appears to have peaked at 0.17 -Continue ASA 81 mg daily  CKD stage 3 w/ fluid overload  -Creatinine improved further today - given significant lower extremity edema must resume diuretic - follow daily weights and I's/O's  Abn liver US -Acute hepatitis panel negative including negative hepatitis C antibody - ammonia level significantly elevated - lactulose initiated an ammonia level falling  Anemia without acute blood loss -Hgb stable - follow  Thrombocytopenia suspect chronic and related to liver disease - follow  DM 2 -CBG reasonably controlled   Code Status: FULL Family Communication: Spoke with wife at bedside Disposition Plan: SDU  Consultants: Neurology Critical care  Antibiotics: Vanc 5/02 > 5/08 Cefepime 5/02 > 5/10  DVT prophylaxis:  Subcutaneous heparin  Objective: Blood pressure 136/50, pulse 90, temperature 98.4 F (36.9 C), temperature source Oral, resp. rate 21, height  (1.778 m), weight 126.8 kg (279 lb 8.7 oz), SpO2 96 %.  Intake/Output  Summary (Last 24 hours) at 10/23/14 1647 Last data filed at 10/23/14 1100  Gross per 24 hour  Intake    600 ml  Output   1200 ml  Net   -600 ml   Exam: General:  No acute respiratory distress - able to understand conversation and participate - oriented times person place and situation but not time Lungs: no wheeze or focal crackles Cardiovascular: RRR w/o gallup or rub  Abdomen: Nontender, markedly protuberent, soft, bowel sounds positive, no rebound, no ascites, no appreciable mass Extremities: No significant cyanosis, clubbing;  B lower extremity edema to groin at 3+ with scrotal edema and very superficial skin irritation posterior aspect of scrotum without significant erythema   Data Reviewed: Basic Metabolic Panel:  Recent Labs Lab 10/19/14 0214 10/20/14 0234 10/21/14 0222 10/22/14 0228 10/23/14 0335  NA 146* 143 141 142 140  K 3.5 3.8 3.8 3.5 3.6  CL 117* 116* 111 110 108  CO2 21* 18* 20* 23 22  GLUCOSE 156* 133* 157* 123* 137*  BUN 28* 30* 32* 31* 28*  CREATININE 1.38* 1.78* 1.87* 1.82* 1.67*  CALCIUM 8.1* 7.8* 7.8* 7.9* 7.8*  MG  --   --  1.8 1.7  --      Liver Function Tests:  Recent Labs Lab 10/17/14 0556 10/18/14 0300 10/21/14 0222 10/22/14 0228 10/23/14 0335  AST 51* 81* 70* 72* 82*  ALT 38 50 54 51 55  ALKPHOS 116 112 120 115 123  BILITOT 1.1 1.3* 1.2 1.2 1.1  PROT 5.2* 5.3* 5.2* 5.9* 5.7*  ALBUMIN 1.7* 1.8* 1.8* 1.8* 1.9*    Recent Labs Lab 10/20/14 2305 10/23/14 0335  AMMONIA 75* 47*   CBC:  Recent Labs Lab 10/18/14 0300 10/19/14 0214 10/20/14 0234 10/22/14 0228 10/23/14 0335  WBC 6.4 5.7 7.3 7.8 7.7  7.7  HGB 9.7* 8.7* 9.2* 8.6* 8.6*  8.6*  HCT 31.2* 28.0* 29.1* 27.3* 27.5*  27.4*  MCV 87.2 85.6 87.1 86.9 86.5  86.4  PLT 94* 75* 78* 71* 103*  103*   CBG:  Recent Labs Lab 10/22/14 1222 10/22/14 1753 10/22/14 2132 10/23/14 0744 10/23/14 1128  GLUCAP 157* 136* 140* 127* 155*    Recent Results (from the past 240  hour(s))  Clostridium Difficile by PCR     Status: None   Collection Time: 10/14/14  4:30 AM  Result Value Ref Range Status   C difficile by pcr NEGATIVE NEGATIVE Final  Gram stain     Status: None   Collection Time: 10/14/14  1:42 PM  Result Value Ref Range Status   Specimen Description CSF  Final   Special Requests NO2  Final   Gram Stain   Final    CYTOSPIN PREP WBC PRESENT, PREDOMINANTLY MONONUCLEAR NO ORGANISMS SEEN    Report Status 10/14/2014 FINAL  Final  CSF culture     Status: None   Collection Time: 10/14/14  1:42 PM  Result Value Ref Range Status   Specimen Description FLUID CSF  Final   Special Requests NO2  Final   Gram Stain   Final    NO WBC SEEN NO ORGANISMS SEEN CYTOSPIN Performed at [facility]    Culture   Final    NO GROWTH 3 DAYS Performed at [facility]  Report Status 10/18/2014 FINAL  Final  Culture, Urine     Status: None   Collection Time: 10/21/14  1:59 AM  Result Value Ref Range Status   Specimen Description URINE, RANDOM  Final   Special Requests NONE  Final   Colony Count   Final    45,000 COLONIES/ML Performed at [facility]    Culture YEAST Performed at [facility]   Final   Report Status 10/22/2014 FINAL  Final     Studies:  Recent x-ray studies have been reviewed in detail by the Attending Physician  Scheduled Meds:  Scheduled Meds: . arformoterol  15 mcg Nebulization BID  . aspirin EC  81 mg Oral Daily  . budesonide  0.25 mg Nebulization BID  . heparin  5,000 Units Subcutaneous 3 times per day  . insulin aspart  0-15 Units Subcutaneous TID WC  . insulin aspart  0-5 Units Subcutaneous QHS  . lactulose  30 g Oral TID  . pantoprazole  40 mg Oral Q1200  . QUEtiapine  25 mg Oral QHS    Time spent on care of this patient: 35 mins  Lonia BloodJeffrey T. Corrado Hymon, MD [facility] For Consults/Admissions - Flow Manager - [phone] Office  [phone]  Contact MD directly via text  page:      [email or web address]      password [email or web address]  10/23/2014, 4:47 PM   LOS: 13 days

## 2014-10-23 NOTE — Progress Notes (Addendum)
Pt still quite deconditioned, a disconnect; [facility] then back to bed. Please consider another PT/OT eval.

## 2014-10-24 LAB — COMPREHENSIVE METABOLIC PANEL
ALBUMIN: 1.9 g/dL — AB (ref 3.5–5.0)
ALK PHOS: 137 U/L — AB (ref 38–126)
ALT: 62 U/L (ref 17–63)
AST: 93 U/L — ABNORMAL HIGH (ref 15–41)
Anion gap: 8 (ref 5–15)
BUN: 22 mg/dL — ABNORMAL HIGH (ref 6–20)
CO2: 23 mmol/L (ref 22–32)
Calcium: 8.2 mg/dL — ABNORMAL LOW (ref 8.9–10.3)
Chloride: 106 mmol/L (ref 101–111)
Creatinine, Ser: 1.53 mg/dL — ABNORMAL HIGH (ref 0.61–1.24)
GFR calc Af Amer: 49 mL/min — ABNORMAL LOW (ref >60)
GFR calc non Af Amer: 42 mL/min — ABNORMAL LOW (ref >60)
Glucose, Bld: 139 mg/dL — ABNORMAL HIGH (ref 65–99)
POTASSIUM: 3.7 mmol/L (ref 3.5–5.1)
SODIUM: 137 mmol/L (ref 135–145)
TOTAL PROTEIN: 6.9 g/dL (ref 6.5–8.1)
Total Bilirubin: 1.1 mg/dL (ref 0.3–1.2)

## 2014-10-24 LAB — GLUCOSE, CAPILLARY
GLUCOSE-CAPILLARY: 158 mg/dL — AB (ref 65–99)
GLUCOSE-CAPILLARY: 141 mg/dL — AB (ref 65–99)
GLUCOSE-CAPILLARY: 142 mg/dL — AB (ref 65–99)
Glucose-Capillary: 153 mg/dL — ABNORMAL HIGH (ref 65–99)
Glucose-Capillary: 135 mg/dL — ABNORMAL HIGH (ref 65–99)

## 2014-10-24 LAB — CBC
HEMATOCRIT: 31.1 % — AB (ref 39.0–52.0)
Hemoglobin: 9.8 g/dL — ABNORMAL LOW (ref 13.0–17.0)
MCH: 27.5 pg (ref 26.0–34.0)
MCHC: 31.5 g/dL (ref 30.0–36.0)
MCV: 87.4 fL (ref 78.0–100.0)
PLATELETS: 122 10*3/uL — AB (ref 150–400)
RBC: 3.56 MIL/uL — ABNORMAL LOW (ref 4.22–5.81)
RDW: 20.4 % — AB (ref 11.5–15.5)
WBC: 8.2 10*3/uL (ref 4.0–10.5)

## 2014-10-24 LAB — AMMONIA: Ammonia: 28 umol/L (ref 9–35)

## 2014-10-24 MED ORDER — GLUCERNA SHAKE PO LIQD
237.0000 mL | Freq: Two times a day (BID) | ORAL | Status: DC
  Administered 2014-10-24 – 2014-10-28 (×8): 237 mL via ORAL

## 2014-10-24 NOTE — Progress Notes (Signed)
[place] TEAM 1 - Stepdown/ICU TEAM Progress Note  Cody RelicJohn W Schmitt YNW:[id number] DOB: 11/19/1934 DOA: 10/10/2014 PCP: Ignatius SpeckingVYAS,DHRUV B., MD   Admit HPI / Brief Narrative: 80 year old M Hx significant for HTN, Asthma, Hepatitis C, and DM 2 who had trouble walking for 3 months and then was noted to have altered mental status on 5/1. [facility] where he was agitated and required several doses of ativan en route. In the ED he was noted to be febrile with elevated WBC meeting SIRS criteria, but no obvious source of infection as CXR and UDS were negative. LP in ED was unsuccessful. He remained agitated throughout his ED stay and required frequent doses of ativan. [facility] and further workup.  Significant Events: 5[facility]2 [facility] head  > no acute intracranial issues 5/4 Neuro consulted 5/3 RUQ US > cholelithiasis, diffuse coarsening of hepatic parenchymal structure 5/4 Echocardiogram  > mild -moderateLVH. EF 55 to 60%, PAS 39 mmHg 5/5 EEG > generalized slowing 5/5 MRI brain  > normal 5/5 intubated 5/6 LP  > glucose 83, protein 133, RBC 251, WBC 1 5/6 Doppler left leg  > no DVT 5/6 off pressors 5/08 extubated 5/09 Very agitated with continuous Precedex - failed SLP swallow eval. NPO  5/10 More alert. Remains poorly oriented. Quetiapine initiated in effort to wean off dexmedetomidine 5/11 agitation improved. Tolerated DC of dexmedetomidine. Lasix and KCl ordered for severe volume overload 5/12 PCXR;Persistent cardiomegaly with bilateral pulmonary infiltrates particularly throughout Rt lung c/w CHF -small pleural effusions   HPI/Subjective: The patient has improved again in regard to his mental status.  His speech is much more intelligible and his response to questions is quicker and more appropriate.  He denies specific complaints at the present time.  He continues to have significant lower extremity and scrotal edema though  his scrotum is much less edematous today.  He denies chest pain nausea vomiting or abdominal pain.  Assessment/Plan:  Acute encephalopathy, unclear etiology  -Patient not on Seroquel prior to admission therefore has been tapered off - extensive evaluation thus far w/o clear etiology for initial confusion though I am now suspicious of hepatic encephalopathy - continue lactulose and follow clinically - mental status has improved coincident with discontinuation of Seroquel and normalization of ammonia level - still not quite back at baseline but has dramatically improved  Acute respiratory failure / Severe sepsis / aspiration pneumonia? -resolved  Volume overload -ongoing diuresis was being limited by climbing cr - now finally net negative approximately 260 cc since admit down from net positive ~8 - follow Is/Os - clinically the patient is stable - denies have persisting though slightly improved lower extremity edema and scrotal and pelvic edema - continue diuretics and follow  Obstructive sleep apnea -CPAP QHS   HTN -BP currently reasonably controlled - follow without change today  NSTEMI vs demand ischemia -hemodynamically stable - troponin appears to have peaked at 0.17 -Continue ASA 81 mg daily  CKD stage 3 w/ fluid overload  -Creatinine improved further even with resumption of diuresis - follow daily weights and I's/O's  Abn liver US -Acute hepatitis panel negative including negative hepatitis C antibody - ammonia level significantly elevated - lactulose initiated an ammonia level just now normalizing - suspect hepatic steatosis - will need further evaluation as patient becomes more stable potentially an outpatient setting  Anemia without acute blood loss -Hgb stable - follow  Thrombocytopenia  suspect chronic and related to liver disease - improving  DM 2 -CBG reasonably controlled - no change in treatment plan today  Code Status: FULL Family Communication: No family present  at time of exam today Disposition Plan: Stable for transfer to telemetry bed - continue diuresis - continue to follow mental status - continue PT/OT  Consultants: Neurology Critical care  Antibiotics: Vanc 5/02 > 5/08 Cefepime 5/02 > 5/10  DVT prophylaxis: Subcutaneous heparin  Objective: Blood pressure 148/64, pulse 91, temperature 98.2 F (36.8 C), temperature source Oral, resp. rate 19, height 5\' 10"  (1.778 m), weight 124.6 kg (274 lb 11.1 oz), SpO2 96 %.  Intake/Output Summary (Last 24 hours) at 10/24/14 1424 Last data filed at 10/24/14 1421  Gross per 24 hour  Intake    600 ml  Output   5950 ml  Net  -5350 ml   Exam: General:  No acute respiratory distress - much more alert today and able to carry on an appropriate conversation Lungs: no wheeze or focal crackles - poor air movement bilateral bases Cardiovascular: RRR w/o gallup or rub  Abdomen: Nontender, markedly protuberent, soft, bowel sounds positive, no rebound, no ascites, no appreciable mass Extremities: No significant cyanosis, clubbing;  B lower extremity edema to groin at 2 + with scrotal edema and very superficial skin irritation posterior aspect of scrotum without significant erythema that has improved today  Data Reviewed: Basic Metabolic Panel:  Recent Labs Lab 10/20/14 0234 10/21/14 0222 10/22/14 0228 10/23/14 0335 10/24/14 0235  NA 143 141 142 140 137  K 3.8 3.8 3.5 3.6 3.7  CL 116* 111 110 108 106  CO2 18* 20* 23 22 23   GLUCOSE 133* 157* 123* 137* 139*  BUN 30* 32* 31* 28* 22*  CREATININE 1.78* 1.87* 1.82* 1.67* 1.53*  CALCIUM 7.8* 7.8* 7.9* 7.8* 8.2*  MG  --  1.8 1.7  --   --      Liver Function Tests:  Recent Labs Lab 10/18/14 0300 10/21/14 0222 10/22/14 0228 10/23/14 0335 10/24/14 0235  AST 81* 70* 72* 82* 93*  ALT 50 54 51 55 62  ALKPHOS 112 120 115 123 137*  BILITOT 1.3* 1.2 1.2 1.1 1.1  PROT 5.3* 5.2* 5.9* 5.7* 6.9  ALBUMIN 1.8* 1.8* 1.8* 1.9* 1.9*    Recent Labs Lab  10/20/14 2305 10/23/14 0335 10/24/14 0315  AMMONIA 75* 47* 28   CBC:  Recent Labs Lab 10/19/14 0214 10/20/14 0234 10/22/14 0228 10/23/14 0335 10/24/14 0235  WBC 5.7 7.3 7.8 7.7  7.7 8.2  HGB 8.7* 9.2* 8.6* 8.6*  8.6* 9.8*  HCT 28.0* 29.1* 27.3* 27.5*  27.4* 31.1*  MCV 85.6 87.1 86.9 86.5  86.4 87.4  PLT 75* 78* 71* 103*  103* 122*   CBG:  Recent Labs Lab 10/23/14 1128 10/23/14 1711 10/23/14 2126 10/24/14 0736 10/24/14 1119  GLUCAP 155* 131* 160* 158* 153*    Recent Results (from the past 240 hour(s))  Culture, Urine     Status: None   Collection Time: 10/21/14  1:59 AM  Result Value Ref Range Status   Specimen Description URINE, RANDOM  Final   Special Requests NONE  Final   Colony Count   Final    45,000 COLONIES/ML Performed at [facility]    Culture YEAST Performed at [facility]   Final   Report Status 10/22/2014 FINAL  Final     Studies:  Recent x-ray studies have been reviewed in detail by the Attending Physician  Scheduled  Meds:  Scheduled Meds: . arformoterol  15 mcg Nebulization BID  . aspirin EC  81 mg Oral Daily  . budesonide  0.25 mg Nebulization BID  . furosemide  80 mg Intravenous Q12H  . furosemide  80 mg Oral BID  . heparin  5,000 Units Subcutaneous 3 times per day  . insulin aspart  0-15 Units Subcutaneous TID WC  . insulin aspart  0-5 Units Subcutaneous QHS  . lactulose  30 g Oral TID  . pantoprazole  40 mg Oral Q1200    Time spent on care of this patient: 35 mins  Lonia Blood, MD [facility] For Consults/Admissions - Flow Manager - [phone] Office  [phone]  Contact MD directly via text page:      [email or web address]      password [facility]  10/24/2014, 2:24 PM   LOS: 14 days

## 2014-10-24 NOTE — Progress Notes (Signed)
Nutrition Follow-up  DOCUMENTATION CODES:  Morbid obesity  INTERVENTION:   Glucerna shake PO BID, each supplement provides 220 kcal and 10 grams of protein  NUTRITION DIAGNOSIS:  Inadequate oral intake related to  (variable appetite and confusion) as evidenced by  (variable PO intake, 25-75% PO's).  Ongoing.  GOAL:  Patient will meet greater than or equal to 90% of their needs  Unmet.  MONITOR:  PO intake, Supplement acceptance, Weight trends, Labs   ASSESSMENT:  Patient [facility] with AMS and fever; [facility] on [facility]/2 for further workup. Required intubation on 5/5.   Extubated on 5/8. Patient somewhat confused during RD visit. Per discussion with RN, he eats well sometimes and poorly at other times. Plans for d/c to SNF for rehab.   Height:  Ht Readings from Last 1 Encounters:  10/13/14 5\' 10"  (1.778 m)    Weight:  Wt Readings from Last 1 Encounters:  10/24/14 274 lb 11.1 oz (124.6 kg)   10/19/14 289 lb 14.5 oz (131.5 kg)   10/13/14 278 lb 10.6 oz (126.4 kg)            Ideal Body Weight:  75.5 kg  BMI:  Body mass index is 39.41 kg/(m^2).  Estimated Nutritional Needs:  Kcal:  2000-2200  Protein:  120-140 gm  Fluid:  2-2.2 L  Skin:  Wound (see comment) (stage 1 pressure ulcer to back)  Diet Order:  Diet heart healthy/carb modified Room service appropriate?: Yes with Assist; Fluid consistency:: Thin  EDUCATION NEEDS:  No education needs identified at this time   Intake/Output Summary (Last 24 hours) at 10/24/14 1448 Last data filed at 10/24/14 1421  Gross per 24 hour  Intake    600 ml  Output   5950 ml  Net  -5350 ml    Last BM:  5/9   Joaquin CourtsKimberly Harris, RD, LDN, CNSC Pager [phone] After Hours Pager [phone]

## 2014-10-24 NOTE — Progress Notes (Signed)
Patient to transfer to 5W17 report given to receiving nurse, all questions answered at this time.  Pt. VSS with no s/s of distress noted.  Pt. Stable at transfer.

## 2014-10-24 NOTE — Progress Notes (Signed)
Physical Therapy Treatment Patient Details Name: Cody RelicJohn W Hayworth MRN: [id number] DOB: 12/20/1934 Today's Date: 10/24/2014    History of Present Illness Adm with AMS, sepsis (?aspiration pna); MRI and LP negative; 5/5-5/8 intubated; +MI PMHx- DM, hepatitis C    PT Comments    Improved mentation;  Gait unsteady, but pt able to focus on task and was participative.  Follow Up Recommendations  CIR     Equipment Recommendations  Other (comment) (TBA)    Recommendations for Other Services       Precautions / Restrictions Precautions Precautions: Fall    Mobility  Bed Mobility Overal bed mobility: Needs Assistance Bed Mobility: Supine to Sit     Supine to sit: Mod assist     General bed mobility comments: pt able to follow simple direction today, but distractable  Transfers Overall transfer level: Needs assistance Equipment used: Rolling walker (2 wheeled) Transfers: Sit to/from Stand Sit to Stand: Mod assist;+2 safety/equipment         General transfer comment: pt able to follow simple direction and cues.  Needed assist to come forward and postural help.  Ambulation/Gait Ambulation/Gait assistance: Mod assist Ambulation Distance (Feet): 20 Feet (x3 and approx 50 feet with RW) Assistive device: Rolling walker (2 wheeled) Gait Pattern/deviations: Step-to pattern Gait velocity: slower   General Gait Details: weak gait with knees generally flexed.  needed postural checks frequently to stand up more upright.   Stairs            Wheelchair Mobility    Modified Rankin (Stroke Patients Only)       Balance Overall balance assessment: Needs assistance Sitting-balance support: No upper extremity supported;Single extremity supported Sitting balance-Leahy Scale: Poor Sitting balance - Comments: Improving with improved mentation   Standing balance support: Bilateral upper extremity supported Standing balance-Leahy Scale: Poor Standing balance comment: flexed  trunk and narrow BOS                    Cognition Arousal/Alertness: Awake/alert Behavior During Therapy: Impulsive Overall Cognitive Status: No family/caregiver present to determine baseline cognitive functioning                      Exercises      General Comments        Pertinent Vitals/Pain Pain Assessment: Faces Faces Pain Scale: No hurt    Home Living                      Prior Function            PT Goals (current goals can now be found in the care plan section) Acute Rehab PT Goals Patient Stated Goal: Pt did not state goals during session PT Goal Formulation: With patient Time For Goal Achievement: 11/01/14 Potential to Achieve Goals: Good Progress towards PT goals: Progressing toward goals    Frequency  Min 3X/week    PT Plan Current plan remains appropriate    Co-evaluation             End of Session   Activity Tolerance: Patient limited by fatigue;Patient tolerated treatment well Patient left: in chair;with call bell/phone within reach;with chair alarm set     Time: [id number] PT Time Calculation (min) (ACUTE ONLY): 24 min  Charges:  $Gait Training: 8-22 mins $Therapeutic Activity: 8-22 mins                    G Codes:  Raegan Winders, Eliseo GumKenneth V 10/24/2014, 4:48 PM 10/24/2014  Crittenden BingKen Yazmine Sorey, PT [phone]  (pager)

## 2014-10-24 NOTE — Progress Notes (Signed)
Medicare Important Message given? YES (If response is "NO", the following Medicare IM given date fields will be blank) Date Medicare IM given:10/24/14 Medicare IM given by: Doralyn Kirkes 

## 2014-10-24 NOTE — Progress Notes (Signed)
Pt arrived to floor in room 5W17, transferred from 3S. Tele placed. Vitals stable. Denies pain. MASD noticed on buttocks. Oriented to room and equipment. Call bell within reach. Will continue to monitor.

## 2014-10-24 NOTE — Progress Notes (Signed)
Rehab admissions - I am following pt's case and met with pt this am. Pt was more alert and interactive but confusion remains. I explained that rehab MD stated skilled nursing is most appropriate option due to decreased caregiver support (wife works full time) and unclear diagnosis for encephalopathy. I told pt that I would call his wife due to his confusion.  I then called pt's wife and shared that rehab MD is recommending SNF. Wife was very upset and does not want to "put my husband in a nursing home." Support was provided and further questions were answered. I explained that Dysheka, social worker will be following up with her on possible SNF options.  I updated pt's RN and Dysheka as well. I will now sign off pt's case.   Thanks.  Nanetta Batty, PT Rehabilitation Admissions Coordinator [phone]

## 2014-10-24 NOTE — Clinical Social Work Note (Signed)
CSW and pt's wife Rene KocherRegina spoke in great lengths about SNF placement. CSW explained the SNF process to [place] again. Rene KocherRegina reported being upset because she misunderstand the purpose of SNF. CSW clarified the purpose of SNF placement is rehab not long-term care placement. Rene KocherRegina apologize for her attitude and behavior. Rene KocherRegina acknowledged having a better understanding of the pt's discharge plan. CSW presented [place] with bed offers. CSW will continue to assist with discharge needs.   Lekeya Rollings, MSW, LCSWA [phone]

## 2014-10-25 LAB — GLUCOSE, CAPILLARY
GLUCOSE-CAPILLARY: 126 mg/dL — AB (ref 65–99)
Glucose-Capillary: 152 mg/dL — ABNORMAL HIGH (ref 65–99)
Glucose-Capillary: 140 mg/dL — ABNORMAL HIGH (ref 65–99)
Glucose-Capillary: 126 mg/dL — ABNORMAL HIGH (ref 65–99)

## 2014-10-25 LAB — RENAL FUNCTION PANEL
ALBUMIN: 2 g/dL — AB (ref 3.5–5.0)
Anion gap: 13 (ref 5–15)
BUN: 20 mg/dL (ref 6–20)
CALCIUM: 8.3 mg/dL — AB (ref 8.9–10.3)
CO2: 23 mmol/L (ref 22–32)
Chloride: 103 mmol/L (ref 101–111)
Creatinine, Ser: 1.54 mg/dL — ABNORMAL HIGH (ref 0.61–1.24)
GFR calc Af Amer: 48 mL/min — ABNORMAL LOW (ref >60)
GFR calc non Af Amer: 42 mL/min — ABNORMAL LOW (ref >60)
Glucose, Bld: 134 mg/dL — ABNORMAL HIGH (ref 65–99)
PHOSPHORUS: 3.7 mg/dL (ref 2.5–4.6)
Potassium: 3.2 mmol/L — ABNORMAL LOW (ref 3.5–5.1)
Sodium: 139 mmol/L (ref 135–145)

## 2014-10-25 LAB — AMMONIA: Ammonia: 50 umol/L — ABNORMAL HIGH (ref 9–35)

## 2014-10-25 MED ORDER — RIFAXIMIN 550 MG PO TABS
550.0000 mg | ORAL_TABLET | Freq: Two times a day (BID) | ORAL | Status: DC
  Administered 2014-10-25 – 2014-10-28 (×7): 550 mg via ORAL
  Filled 2014-10-25 (×8): qty 1

## 2014-10-25 MED ORDER — POTASSIUM CHLORIDE CRYS ER 20 MEQ PO TBCR
40.0000 meq | EXTENDED_RELEASE_TABLET | Freq: Once | ORAL | Status: AC
  Administered 2014-10-25: 40 meq via ORAL
  Filled 2014-10-25: qty 2

## 2014-10-25 MED ORDER — FUROSEMIDE 10 MG/ML IJ SOLN
80.0000 mg | Freq: Two times a day (BID) | INTRAMUSCULAR | Status: DC
  Administered 2014-10-25 – 2014-10-28 (×6): 80 mg via INTRAVENOUS
  Filled 2014-10-25 (×6): qty 8

## 2014-10-25 MED ORDER — METOPROLOL SUCCINATE ER 25 MG PO TB24
25.0000 mg | ORAL_TABLET | Freq: Every day | ORAL | Status: DC
  Administered 2014-10-25 – 2014-10-28 (×4): 25 mg via ORAL
  Filled 2014-10-25 (×4): qty 1

## 2014-10-25 MED ORDER — SPIRONOLACTONE 50 MG PO TABS
50.0000 mg | ORAL_TABLET | Freq: Two times a day (BID) | ORAL | Status: DC
  Administered 2014-10-25 – 2014-10-28 (×7): 50 mg via ORAL
  Filled 2014-10-25 (×8): qty 1

## 2014-10-25 NOTE — Progress Notes (Signed)
Physical Therapy Treatment Patient Details Name: Cody RelicJohn W Jarnigan MRN: [id number] DOB: 03/25/1935 Today's Date: 10/25/2014    History of Present Illness Adm with AMS, sepsis (?aspiration pna); MRI and LP negative; 5/5-5/8 intubated; +MI PMHx- DM, hepatitis C    PT Comments    Pt did marching in place and sit to stands to work on posture, balance and leg strengthening  Pt cooperative and pleasant but needed moderate cueing to continue.    Follow Up Recommendations  CIR     Equipment Recommendations       Recommendations for Other Services       Precautions / Restrictions Precautions Precautions: Fall Restrictions Weight Bearing Restrictions: No    Mobility  Bed Mobility Overal bed mobility: Needs Assistance       Supine to sit: Supervision;HOB elevated     General bed mobility comments: pt able to follow simple direction today, but distractable  Transfers Overall transfer level: Needs assistance Equipment used: Rolling walker (2 wheeled) Transfers: Sit to/from Stand Sit to Stand: Min guard         General transfer comment: I had pt do sit to stands x 2 sets of 5 reps from EOB  Ambulation/Gait             General Gait Details: Today i had pt marching on EOB with RW = I counted each step with right leg and pt did 5 sets of 10 rpes with cues for breathing and upright posture.     Stairs            Wheelchair Mobility    Modified Rankin (Stroke Patients Only)       Balance                                    Cognition Arousal/Alertness: Awake/alert Behavior During Therapy: Impulsive Overall Cognitive Status: No family/caregiver present to determine baseline cognitive functioning                      Exercises      General Comments        Pertinent Vitals/Pain Pain Assessment: No/denies pain    Home Living                      Prior Function            PT Goals (current goals can now be found in  the care plan section) Progress towards PT goals: Progressing toward goals    Frequency  Min 3X/week    PT Plan Current plan remains appropriate    Co-evaluation             End of Session   Activity Tolerance: Patient limited by fatigue;Patient tolerated treatment well Patient left: in chair;with call bell/phone within reach     Time: 1450-1515 PT Time Calculation (min) (ACUTE ONLY): 25 min  Charges:  $Therapeutic Activity: 23-37 mins                    G Codes:      Judson RochHildreth, Winfred Redel Gardner 10/25/2014, 3:20 PM  Ranae PalmsElizabeth Alquan Morrish, PT

## 2014-10-25 NOTE — Progress Notes (Signed)
Placed pt on cpap at thie time, pt tolerating well

## 2014-10-25 NOTE — Progress Notes (Signed)
Progress Note  Babette RelicJohn W Stults WUJ:[id number] DOB: 12/01/1934 DOA: 10/10/2014 PCP: Ignatius SpeckingVYAS,DHRUV B., MD   Admit HPI / Brief Narrative: 80 year old M Hx significant for HTN, Asthma, Hepatitis C, and DM 2 who had trouble walking for 3 months and then was noted to have altered mental status on 5/1. He [facility] via [facility] where he was agitated and required several doses of ativan en route. In the ED he was noted to be febrile with elevated WBC meeting SIRS criteria, but no obvious source of infection as CXR and UDS were negative. LP in ED was unsuccessful. He remained agitated throughout his ED stay and required frequent doses of ativan. He was [facility] and further workup.  Significant Events: 5/2 Transfer from [facility] 5/2 CT head  > no acute intracranial issues 5/4 Neuro consulted 5/3 RUQ US > cholelithiasis, diffuse coarsening of hepatic parenchymal structure 5/4 Echocardiogram  > mild -moderateLVH. EF 55 to 60%, PAS 39 mmHg 5/5 EEG > generalized slowing 5/5 MRI brain  > normal 5/5 intubated 5/6 LP  > glucose 83, protein 133, RBC 251, WBC 1 5/6 Doppler left leg  > no DVT 5/6 off pressors 5/08 extubated 5/09 Very agitated with continuous Precedex - failed SLP swallow eval. NPO  5/10 More alert. Remains poorly oriented. Quetiapine initiated in effort to wean off dexmedetomidine 5/11 agitation improved. Tolerated DC of dexmedetomidine. Lasix and KCl ordered for severe volume overload 5/12 PCXR;Persistent cardiomegaly with bilateral pulmonary infiltrates particularly throughout Rt lung c/w CHF -small pleural effusions   Assessment/Plan:  Acute encephalopathy, likely hepatic encephalopathy NH3 up, asterixis, and confused on lactulose. Add xifaxan.  Acute respiratory failure / Severe sepsis / aspiration pneumonia? -resolved  Volume overload Weight improving but still with 2-3+ edema. Continue IV lasix. Add spironolactone. Echo with normal  EF  Obstructive sleep apnea -CPAP QHS   HTN controlled  NSTEMI vs demand ischemia -hemodynamically stable - troponin appears to have peaked at 0.17 -Continue ASA 81 mg daily  CKD stage 3 w/ fluid overload  Creatinine improved  Abn liver US Hep b and c neg. A pending. ?cirrhosis  Anemia without acute blood loss -Hgb stable - follow  Thrombocytopenia suspect chronic and related to liver disease - improving  DM 2 Controlled  Pt/ot rec cir. Will consult  Code Status: FULL Family Communication: No family present at time of exam today Disposition Plan: cir?   Consultants: Neurology Critical care  Antibiotics: Vanc 5/02 > 5/08 Cefepime 5/02 > 5/10  HPI/Subjective: Unable. Confused.  Objective: Blood pressure 121/58, pulse 94, temperature 97.7 F (36.5 C), temperature source Oral, resp. rate 20, height 5\' 10"  (1.778 m), weight 121.5 kg (267 lb 13.7 oz), SpO2 97 %.  Intake/Output Summary (Last 24 hours) at 10/25/14 1258 Last data filed at 10/25/14 1027  Gross per 24 hour  Intake    720 ml  Output   2600 ml  Net  -1880 ml   Exam: General:  Confused. Eating lunch. Cooperative. alert Lungs: CTA without WRR Cardiovascular: RRR w/o murmur gallup or rub  Abdomen: Nontender, obese, soft. BS present Extremities: No significant cyanosis, clubbing;  B lower extremity edema2-3+. asterixis  Data Reviewed: Basic Metabolic Panel:  Recent Labs Lab 10/21/14 0222 10/22/14 0228 10/23/14 0335 10/24/14 0235 10/25/14 0418  NA 141 142 140 137 139  K 3.8 3.5 3.6 3.7 3.2*  CL 111 110 108 106 103  CO2 20* 23 22 23 23   GLUCOSE 157* 123*  137* 139* 134*  BUN 32* 31* 28* 22* 20  CREATININE 1.87* 1.82* 1.67* 1.53* 1.54*  CALCIUM 7.8* 7.9* 7.8* 8.2* 8.3*  MG 1.8 1.7  --   --   --   PHOS  --   --   --   --  3.7     Liver Function Tests:  Recent Labs Lab 10/21/14 0222 10/22/14 0228 10/23/14 0335 10/24/14 0235 10/25/14 0418  AST 70* 72* 82* 93*  --   ALT 54 51 55 62   --   ALKPHOS 120 115 123 137*  --   BILITOT 1.2 1.2 1.1 1.1  --   PROT 5.2* 5.9* 5.7* 6.9  --   ALBUMIN 1.8* 1.8* 1.9* 1.9* 2.0*    Recent Labs Lab 10/20/14 2305 10/23/14 0335 10/24/14 0315 10/25/14 0417  AMMONIA 75* 47* 28 50*   CBC:  Recent Labs Lab 10/19/14 0214 10/20/14 0234 10/22/14 0228 10/23/14 0335 10/24/14 0235  WBC 5.7 7.3 7.8 7.7  7.7 8.2  HGB 8.7* 9.2* 8.6* 8.6*  8.6* 9.8*  HCT 28.0* 29.1* 27.3* 27.5*  27.4* 31.1*  MCV 85.6 87.1 86.9 86.5  86.4 87.4  PLT 75* 78* 71* 103*  103* 122*   CBG:  Recent Labs Lab 10/24/14 1413 10/24/14 1623 10/24/14 2053 10/25/14 0808 10/25/14 1227  GLUCAP 141* 142* 135* 126* 152*    Recent Results (from the past 240 hour(s))  Culture, Urine     Status: None   Collection Time: 10/21/14  1:59 AM  Result Value Ref Range Status   Specimen Description URINE, RANDOM  Final   Special Requests NONE  Final   Colony Count   Final    45,000 COLONIES/ML Performed at [facility]    Culture YEAST Performed at [facility]   Final   Report Status 10/22/2014 FINAL  Final     Studies:  Recent x-ray studies have been reviewed in detail by the Attending Physician  Scheduled Meds:  Scheduled Meds: . arformoterol  15 mcg Nebulization BID  . aspirin EC  81 mg Oral Daily  . budesonide  0.25 mg Nebulization BID  . feeding supplement (GLUCERNA SHAKE)  237 mL Oral BID BM  . furosemide  80 mg Intravenous BID  . heparin  5,000 Units Subcutaneous 3 times per day  . insulin aspart  0-15 Units Subcutaneous TID WC  . insulin aspart  0-5 Units Subcutaneous QHS  . lactulose  30 g Oral TID  . pantoprazole  40 mg Oral Q1200  . rifaximin  550 mg Oral BID  . spironolactone  50 mg Oral BID    Time spent on care of this patient: 35 mins  Crista Curborinna Paxson Harrower, MD [facility]       [email or web address]      password [email or web address]  10/25/2014, 12:58 PM   LOS: 15 days

## 2014-10-25 NOTE — Progress Notes (Signed)
Order received to screen pt for a possible inpt rehab admission. Dr. Naaman Plummer as well as Nanetta Batty, Admissions Coordinator has consulted and met with pt's wife. SNF rehab is recommended as pt will require 24/7 assist at home, wife works and pt with encephalopathy. If further questions, please feel free to contact [place], Admissions Coordinator, at [phone].

## 2014-10-26 ENCOUNTER — Encounter: Payer: Self-pay | Admitting: *Deleted

## 2014-10-26 DIAGNOSIS — R410 Disorientation, unspecified: Secondary | ICD-10-CM | POA: Diagnosis present

## 2014-10-26 LAB — CBC
HCT: 31.1 % — ABNORMAL LOW (ref 39.0–52.0)
Hemoglobin: 9.8 g/dL — ABNORMAL LOW (ref 13.0–17.0)
MCH: 27.7 pg (ref 26.0–34.0)
MCHC: 31.5 g/dL (ref 30.0–36.0)
MCV: 87.9 fL (ref 78.0–100.0)
PLATELETS: 164 10*3/uL (ref 150–400)
RBC: 3.54 MIL/uL — ABNORMAL LOW (ref 4.22–5.81)
RDW: 20.9 % — AB (ref 11.5–15.5)
WBC: 7.1 10*3/uL (ref 4.0–10.5)

## 2014-10-26 LAB — GLUCOSE, CAPILLARY
GLUCOSE-CAPILLARY: 126 mg/dL — AB (ref 65–99)
GLUCOSE-CAPILLARY: 133 mg/dL — AB (ref 65–99)
GLUCOSE-CAPILLARY: 122 mg/dL — AB (ref 65–99)

## 2014-10-26 LAB — BASIC METABOLIC PANEL
Anion gap: 9 (ref 5–15)
BUN: 18 mg/dL (ref 6–20)
CALCIUM: 8.5 mg/dL — AB (ref 8.9–10.3)
CHLORIDE: 104 mmol/L (ref 101–111)
CO2: 26 mmol/L (ref 22–32)
CREATININE: 1.59 mg/dL — AB (ref 0.61–1.24)
GFR calc Af Amer: 47 mL/min — ABNORMAL LOW (ref >60)
GFR calc non Af Amer: 40 mL/min — ABNORMAL LOW (ref >60)
GLUCOSE: 120 mg/dL — AB (ref 65–99)
Potassium: 3.4 mmol/L — ABNORMAL LOW (ref 3.5–5.1)
Sodium: 139 mmol/L (ref 135–145)

## 2014-10-26 LAB — AMMONIA: AMMONIA: 135 umol/L — AB (ref 9–35)

## 2014-10-26 MED ORDER — QUETIAPINE FUMARATE 25 MG PO TABS
25.0000 mg | ORAL_TABLET | Freq: Three times a day (TID) | ORAL | Status: DC | PRN
  Administered 2014-10-26: 25 mg via ORAL
  Filled 2014-10-26 (×2): qty 1

## 2014-10-26 MED ORDER — LACTULOSE ENEMA
300.0000 mL | Freq: Once | ORAL | Status: DC
  Filled 2014-10-26: qty 300

## 2014-10-26 MED ORDER — LACTULOSE 10 GM/15ML PO SOLN
30.0000 g | Freq: Three times a day (TID) | ORAL | Status: DC
  Administered 2014-10-26 – 2014-10-28 (×7): 30 g via ORAL
  Filled 2014-10-26 (×8): qty 45

## 2014-10-26 MED ORDER — POTASSIUM CHLORIDE CRYS ER 20 MEQ PO TBCR
40.0000 meq | EXTENDED_RELEASE_TABLET | Freq: Once | ORAL | Status: AC
  Administered 2014-10-26: 40 meq via ORAL
  Filled 2014-10-26: qty 2

## 2014-10-26 MED ORDER — LACTULOSE 10 GM/15ML PO SOLN
40.0000 g | Freq: Three times a day (TID) | ORAL | Status: DC
  Administered 2014-10-26: 40 g via ORAL
  Filled 2014-10-26 (×3): qty 60

## 2014-10-26 NOTE — Progress Notes (Addendum)
Physical Therapy Treatment Patient Details Name: Cody RelicJohn W [place] MRN: [id number] DOB: 12/25/1934 Today's Date: 10/26/2014    History of Present Illness Adm with AMS, sepsis (?aspiration pna); MRI and LP negative; 5/5-5/8 intubated; +MI PMHx- DM, hepatitis C    PT Comments    Patient progressing slowly with stability and safety with ambulation.  Still feels weak and somewhat shaky, but HR max 110 with ambulation and limited by c/o nausea.  Working as well with ADL's for cognitive aspect and pt participating about 15-20% with task this AM.  Disposition changed to SNF due to rehab stating will need 24 hour care following d/c due to encephalopathy and pt's wife works.  Follow Up Recommendations  SNF     Equipment Recommendations  Other (comment) (TBA)    Recommendations for Other Services       Precautions / Restrictions Precautions Precautions: Fall    Mobility  Bed Mobility Overal bed mobility: Needs Assistance       Supine to sit: Supervision;HOB elevated     General bed mobility comments: follows one step commands with increased time for getting to EOB  Transfers Overall transfer level: Needs assistance Equipment used: Rolling walker (2 wheeled) Transfers: Sit to/from Stand Sit to Stand: Min guard;Min assist         General transfer comment: assist for balance, cues for hand placement  Ambulation/Gait Ambulation/Gait assistance: Min assist Ambulation Distance (Feet): 25 Feet Assistive device: Rolling walker (2 wheeled)       General Gait Details: to door and back around bed to chair, pt c/o nausea, improved with seated rest; kept walker at a distance so assist and cues to keep walker close and for upright posture   Stairs            Wheelchair Mobility    Modified Rankin (Stroke Patients Only)       Balance Overall balance assessment: Needs assistance         Standing balance support: Bilateral upper extremity supported Standing  balance-Leahy Scale: Poor Standing balance comment: needs UE support for balance; stood x about 2 minutes while sitter assist with perineal hygiene during bathing                    Cognition Arousal/Alertness: Awake/alert Behavior During Therapy: WFL for tasks assessed/performed Overall Cognitive Status: No family/caregiver present to determine baseline cognitive functioning                      Exercises General Exercises - Lower Extremity Hip Flexion/Marching: AROM;Both;10 reps;Standing Other Exercises Other Exercises: participated in upper body bathing for arms, chest and face    General Comments        Pertinent Vitals/Pain Faces Pain Scale: Hurts little more Pain Location: buttocks Pain Intervention(s): Monitored during session;Repositioned    Home Living                      Prior Function            PT Goals (current goals can now be found in the care plan section) Progress towards PT goals: Progressing toward goals    Frequency  Min 3X/week    PT Plan Discharge plan needs to be updated    Co-evaluation             End of Session Equipment Utilized During Treatment: Gait belt Activity Tolerance: Patient tolerated treatment well Patient left: in chair;with nursing/sitter in room;with call bell/phone within  reach     Time: 0952-1017 PT Time Calculation (min) (ACUTE ONLY): 25 min  Charges:  $Gait Training: 8-22 mins $Therapeutic Activity: 8-22 mins                    G Codes:      WYNN,CYNDI 10/26/2014, 10:29 AM  Sheran Lawlessyndi Wynn, PT [phone] 10/26/2014

## 2014-10-26 NOTE — Progress Notes (Signed)
Patient appears more confused tonight than last night.Patient frequently trying to get out of bed and pulling at telemetry ,and foley catheter.Patient is alert to self and knows he's in hospital but unsure of name of hospital.Side rails up x 3 and bed alarm on will continue to monitor patient.

## 2014-10-26 NOTE — Progress Notes (Signed)
Pt. Is very confused at this time (trying to get out of bed) will hold CPAP for now due to confusion & agitation. RN is aware.

## 2014-10-26 NOTE — Progress Notes (Signed)
Utilization Review completed. Saketh Daubert RN BSN CM 

## 2014-10-26 NOTE — Progress Notes (Signed)
[facility]                                                                              Patient Demographics  Cody Schmitt, is a 80 y.o. male, DOB - 05/03/1935, ZOX:[id number]  Admit date - 10/10/2014   Admitting Physician Lupita Leash, MD  Outpatient Primary MD for the patient is VYAS,DHRUV B., MD  LOS - 16   No chief complaint on file.      Brief HPI   The patient is a 80 year old male with hypertension, asthma, hepatitis C, dyslipidemia, GERD, diabetes, had trouble walking for the past 3 months, was noted to have altered mental status 5/1. He was taken to [facility] ED and was agitated requiring several doses of Ativan. In ED, patient was noted to be febrile with elevated leukocytosis but no obvious source of infection. LP was attempted in ED and was unsuccessful. Patient was transferred to [facility] for ICU admission and further workup. CT head was negative for acute intracranial issues. Patient remained agitated at the time of admission and was placed on Precedex drip and in restraints. Neuro was consulted. EEG showed generalized slowing but no seizure focus, MRI of the brain was normal. 2-D echocardiogram showed mild-to-moderate LVH, EF 55-60%, right upper quadrant ultrasound showed cholelithiasis with diffuse coarsening of the hepatic parenchymal structure. Patient's agitation worsened off the Precedex and had to take coarse secretions and acute respiratory failure standard respiration patient was intubated on 5/5. LP was performed on 5/6,  LP > glucose 83, protein 133, RBC 251, WBC 1 Patient was extubated on 5/8 however remained on Precedex. Patient was finally weaned off Precedex on 5/11 and placed on IV diuresis for severe volume overload. Dopplers of the left leg showed no DVT. Patient was transferred to stepdown on 5/11 and transferred to hospitalist service on 5/12. Patient continues to have waxing and waning mental status, has sitter.       Assessment &  Plan    Principal Problem: Acute encephalopathy, likely due to hepatic encephalopathy, possibly hepatic steatosis  - Patient had extensive workup during this admission including spinal tap, MRI of the brain, EEG, all negative - He was briefly placed on Seroquel which was discontinued due to no improved affect, patient however remains agitated - Ammonia level checked today-> 135, increase lactulose to 40 mg TID and will give 1 dose of lactulose enema - Continue rifaximin  - Patient had a right upper quadrant ultrasound on 5/3 which showed cholelithiasis with diffusely coarsened hepatic parenchymal echotexture, possibly due to cirrhosis  Activity problems Acute respiratory failure / Severe sepsis / aspiration pneumonia? -Extubated, currently O2 sats 99% on room air, resolved  Volume overload -Continue IV Lasix with potassium replacement as needed patient has significant clinical volume overload  - Good response to diuresis, negative balance of 4.6 L however still has lower extremity edema with scrotal edema   Obstructive sleep apnea -CPAP QHS   HTN -Currently stable, continue Lasix, metoprolol  NSTEMI vs demand ischemia -hemodynamically stable - troponin appears to have peaked at 0.17 -Continue ASA 81 mg daily  CKD stage 3 w/ fluid overload  -Creatinine  stable at 1.5, follow closely with diureses   Anemia without acute blood loss -Hemoglobin stable, 9.8, transfuse if hemoglobin less than 8  Thrombocytopenia suspect chronic and related to liver disease - improving  DM 2 -CBG is currently controlled, continue sliding scale insulin    Code Status: full code  Family Communication: Discussed in detail with the patient, all imaging results, lab results explained to the patient. No family member at the bedside   Disposition Plan: Not medically stable  Time Spent in minutes   Procedures  intubation  Consults   Pulmonology  Psychiatry Neurology  DVT  Prophylaxis   heparin Medications  Scheduled Meds: . arformoterol  15 mcg Nebulization BID  . aspirin EC  81 mg Oral Daily  . budesonide  0.25 mg Nebulization BID  . feeding supplement (GLUCERNA SHAKE)  237 mL Oral BID BM  . furosemide  80 mg Intravenous BID  . heparin  5,000 Units Subcutaneous 3 times per day  . insulin aspart  0-15 Units Subcutaneous TID WC  . insulin aspart  0-5 Units Subcutaneous QHS  . lactulose  40 g Oral TID  . metoprolol succinate  25 mg Oral Daily  . pantoprazole  40 mg Oral Q1200  . rifaximin  550 mg Oral BID  . spironolactone  50 mg Oral BID   Continuous Infusions:  PRN Meds:.hydrALAZINE, QUEtiapine   Antibiotics   Anti-infectives    Start     Dose/Rate Route Frequency Ordered Stop   10/25/14 1300  rifaximin (XIFAXAN) tablet 550 mg     550 mg Oral 2 times daily 10/25/14 1255     10/18/14 1100  ceFEPIme (MAXIPIME) 2 g in dextrose 5 % 50 mL IVPB     2 g 100 mL/hr over 30 Minutes Intravenous Every 24 hours 10/18/14 1050 10/18/14 1707   10/14/14 1100  ceFEPIme (MAXIPIME) 2 g in dextrose 5 % 50 mL IVPB  Status:  Discontinued     2 g 100 mL/hr over 30 Minutes Intravenous Every 12 hours 10/14/14 0951 10/18/14 1050   10/11/14 0000  vancomycin (VANCOCIN) 1,500 mg in sodium chloride 0.9 % 500 mL IVPB  Status:  Discontinued     1,500 mg 250 mL/hr over 120 Minutes Intravenous Every 24 hours 10/10/14 1814 10/17/14 1141   10/10/14 2000  acyclovir (ZOVIRAX) 615 mg in dextrose 5 % 100 mL IVPB  Status:  Discontinued     10 mg/kg  61.4 kg (Ideal) 112.3 mL/hr over 60 Minutes Intravenous Every 8 hours 10/10/14 1814 10/11/14 1027   10/10/14 1900  ceFEPIme (MAXIPIME) 2 g in dextrose 5 % 50 mL IVPB  Status:  Discontinued     2 g 100 mL/hr over 30 Minutes Intravenous  Once 10/10/14 1747 10/10/14 1814   10/10/14 1900  ceFEPIme (MAXIPIME) 2 g in dextrose 5 % 50 mL IVPB  Status:  Discontinued     2 g 100 mL/hr over 30 Minutes Intravenous Every 24 hours 10/10/14 1814  10/14/14 4540        Subjective:   [name] was seen and examined today.  Patient seen and examined, still significantly confused, denies any chest pain and shortness of breath or abdominal pain. No fevers or chills. Afebrile, sitter in the room, overnight was agitated and pulling his IV lines and Foley catheter.  Objective:   Blood pressure 125/67, pulse 78, temperature 98.6 F (37 C), temperature source Oral, resp. rate 20, height  (1.778 m), weight 119.1 kg (262 lb  9.1 oz), SpO2 99 %.  Wt Readings from Last 3 Encounters:  10/26/14 119.1 kg (262 lb 9.1 oz)  02/18/13 121.927 kg (268 lb 12.8 oz)     Intake/Output Summary (Last 24 hours) at 10/26/14 1438 Last data filed at 10/26/14 1250  Gross per 24 hour  Intake    360 ml  Output   3035 ml  Net  -2675 ml    Exam  General: Alert and oriented x 1/self, NAD  HEENT:  PERRLA, EOMI, Anicteic Sclera, mucous membranes moist.   Neck: Supple, no JVD, no masses  CVS: S1 S2 auscultated, no rubs, murmurs or gallops. Regular rate and rhythm.  Respiratory: Clear to auscultation bilaterally, no wheezing, rales or rhonchi  Abdomen: Soft, nontender, nondistended, + bowel sounds  Ext: no cyanosis clubbing, 2+ pitting edema  Neuro: Strength 5/5 upper and lower extremities bilaterally  Skin: No rashes  Psych: Normal affect and demeanor, alert and oriented x1    Data Review   Micro Results Recent Results (from the past 240 hour(s))  Culture, Urine     Status: None   Collection Time: 10/21/14  1:59 AM  Result Value Ref Range Status   Specimen Description URINE, RANDOM  Final   Special Requests NONE  Final   Colony Count   Final    45,000 COLONIES/ML Performed at [facility]    Culture YEAST Performed at [facility]   Final   Report Status 10/22/2014 FINAL  Final    Radiology Reports Mr Laqueta JeanBrain W Wo Contrast  10/14/2014   CLINICAL DATA:  Altered mental status after taking a nap, RIGHT upper  quadrant pain. Assess acute encephalopathy. History of hepatitis-C, diabetes.  EXAM: MRI HEAD WITHOUT AND WITH CONTRAST  TECHNIQUE: Multiplanar, multiecho pulse sequences of the brain and surrounding structures were obtained without and with intravenous contrast.  CONTRAST:  20mL MULTIHANCE GADOBENATE DIMEGLUMINE 529 MG/ML IV SOLN  COMPARISON:  CT of the head Oct 09, 2014  FINDINGS: The ventricles and sulci are normal for patient's age. Patchy supratentorial white matter T2 hyperintensities are within normal range for patient's age. No abnormal parenchymal signal, mass lesions, mass effect. No abnormal parenchymal enhancement though coronal motion degraded sequences. No reduced diffusion to suggest acute ischemia. No susceptibility artifact to suggest hemorrhage.  No abnormal extra-axial fluid collections. No extra-axial masses nor leptomeningeal enhancement. Normal major intracranial vascular flow voids seen at the skull base.  Ocular globes and orbital contents are normal though not tailored for evaluation ; bilateral ocular lens implants. No abnormal sellar expansion. Trace paranasal sinus mucosal thickening, trace mastoid effusions. No suspicious calvarial bone marrow signal. Craniocervical junction maintained.  IMPRESSION: Normal mildly motion degraded MRI of the brain with and without contrast for age.   Electronically Signed   By: Awilda Metroourtnay  Bloomer   On: 10/14/2014 00:50   Dg Chest Port 1 View  10/20/2014   CLINICAL DATA:  Respiratory failure.  Shortness of breath.  EXAM: PORTABLE CHEST - 1 VIEW  COMPARISON:  10/19/2014.  FINDINGS: Mediastinum hilar structures are normal. Cardiomegaly with bilateral pulmonary infiltrates particularly prominent throughout the right lung. No significant change. Small pleural effusions cannot be excluded. No pneumothorax.  IMPRESSION: Persistent cardiomegaly with bilateral pulmonary infiltrates particularly throughout the right lung consistent with congestive heart failure  with pulmonary edema. Bilateral pneumonia could also present in this fashion. Associated small pleural effusions are present. Chest is unchanged from prior exam.   Electronically Signed   By: Maisie Fushomas  Register  On: 10/20/2014 07:06   Dg Chest Port 1 View  10/19/2014   CLINICAL DATA:  Respiratory failure  EXAM: PORTABLE CHEST - 1 VIEW  COMPARISON:  10/18/2014  FINDINGS: Cardiac shadow is stable. Diffuse right-sided infiltrate is again noted. Vascular congestion and interstitial edema is seen which is new from the prior study. The left lung show slow lower lobe atelectatic changes.  IMPRESSION: Increasing central vascular congestion with pulmonary edema.  Stable infiltrative changes on the right.  New left basilar atelectasis.   Electronically Signed   By: Alcide Cody M.D.   On: 10/19/2014 07:45   Dg Chest Port 1 View  10/18/2014   CLINICAL DATA:  Respiratory failure.  EXAM: PORTABLE CHEST - 1 VIEW  COMPARISON:  10/17/2014.  FINDINGS: Interim removal right subclavian line. Mediastinum and hilar structures are normal. Stable diffuse right lung infiltrate is present. Mild left base subsegmental atelectasis scratched is present . No pleural effusion or pneumothorax. Cardiomegaly with normal pulmonary vascularity.  IMPRESSION: 1. Diffuse right lung infiltrate again noted. No interim improvement. 2. Mild left base subsegmental atelectasis. 3. Stable cardiomegaly.   Electronically Signed   By: Maisie Fus  Register   On: 10/18/2014 07:28   Dg Chest Port 1 View  10/17/2014   CLINICAL DATA:  Aspiration pneumonia.  EXAM: PORTABLE CHEST - 1 VIEW  COMPARISON:  10/16/2014.  FINDINGS: Interim removal of endotracheal tube and NG tube. Right subclavian line stable position. Progressive diffuse right lung infiltrate. Mild left lower lobe infiltrate again noted. Stable cardiomegaly. Normal pulmonary vascularity. Small right pleural effusion noted. No pneumothorax. No acute bony abnormality.  IMPRESSION: 1.  Right subclavian line  in stable position.  2. Progressive diffuse right lung infiltrate. Persistent left lower lobe infiltrate. Small right pleural effusion.   Electronically Signed   By: Maisie Fus  Register   On: 10/17/2014 07:29   Dg Chest Port 1 View  10/16/2014   CLINICAL DATA:  Followup aspiration pneumonia.  Intubated.  EXAM: PORTABLE CHEST - 1 VIEW  COMPARISON:  Yesterday.  FINDINGS: Endotracheal tube in satisfactory position. Nasogastric tube extending into the stomach. Right subclavian catheter tip in the superior vena cava. Stable enlarged cardiac silhouette and mildly prominent pulmonary vasculature. Decreased bilateral airspace opacity. No definite pleural fluid. Thoracic spine degenerative changes.  IMPRESSION: 1. Improving bilateral alveolar edema or pneumonia. 2. Stable cardiomegaly.   Electronically Signed   By: Beckie Salts M.D.   On: 10/16/2014 08:28   Dg Chest Port 1 View  10/15/2014   CLINICAL DATA:  Aspiration pneumonia  EXAM: PORTABLE CHEST - 1 VIEW  COMPARISON:  10/14/2014  FINDINGS: Endotracheal tube tip is 7.9 cm above the carina. Nasogastric tube extends below the diaphragm and off the inferior edge of the image. There is a right subclavian central line with tip in the SVC. Right upper lobe consolidation is slightly improved, now with less densely confluent consolidation. There is unchanged mild airspace opacity in both bases. No large effusions are evident.  IMPRESSION: Partial clearance of right upper lobe consolidation. Support equipment appears satisfactorily positioned.   Electronically Signed   By: Ellery Plunk M.D.   On: 10/15/2014 07:10   Dg Chest Port 1 View  10/14/2014   CLINICAL DATA:  80 year old male with a history of aspiration pneumonia.  EXAM: PORTABLE CHEST - 1 VIEW  COMPARISON:  10/13/2014  FINDINGS: Persisting right-sided airspace disease with low lung volumes. Persisting retrocardiac opacity.  Cardiomediastinal silhouette not well evaluated.  Endotracheal tube terminates approximately  1 cm from the  carina. This appears to have been advanced from the comparison.  Unchanged right subclavian central catheter which appears to terminate superior vena cava.  Enteric tube projects over the mediastinum, terminating out of the field of view.  IMPRESSION: Low lung volumes with persisting right-sided airspace disease.  Endotracheal tube terminates proximally 1 cm from the carina. This may be withdrawn 4 cm to 5 cm for better position.  These results were called by telephone at the time of interpretation on 10/14/2014 at 7:02 am to the nurse caring for the patient, Ms. Felicia Flynt who verbally acknowledged these results.  Unchanged right subclavian central line and enteric tube.  Signed,  Yvone NeuJaime S. Loreta AveWagner, DO  [facility]  [facility]   Electronically Signed   By: Gilmer MorJaime  Wagner D.O.   On: 10/14/2014 07:04   Dg Chest Port 1 View  10/13/2014   CLINICAL DATA:  Hypoxia  EXAM: PORTABLE CHEST - 1 VIEW  COMPARISON:  Study obtained earlier in the day  FINDINGS: Endotracheal tube tip is 3.3 cm above the carina. Nasogastric tube tip and side port are below the diaphragm. Central catheter tip is in the superior vena cava. No pneumothorax. There is persistent airspace consolidation throughout much of the right lung, stable. There is patchy atelectatic type change in the left base. No new opacity compared to earlier in the day. Heart is upper normal in size with pulmonary vascularity within normal limits. No adenopathy. There is degenerative change in the thoracic spine.  IMPRESSION: Tube and catheter positions as described without pneumothorax. Stable extensive airspace opacification on the right. Patchy atelectasis left base. No new opacity compared to earlier in the day. No change in cardiac silhouette.   Electronically Signed   By: Bretta BangWilliam  Woodruff III M.D.   On: 10/13/2014 12:39   Dg Chest Port 1 View  10/13/2014   CLINICAL DATA:  Dyspnea  EXAM: PORTABLE CHEST - 1  VIEW  COMPARISON:  10/10/2014  FINDINGS: There is a right subclavian central line with tip in the SVC. There is no pneumothorax. There is new confluent right upper lobe consolidation. This could represent pneumonia. However, it could also represent asymmetric pulmonary edema as there are moderate congestive changes in the vasculature and interstitium. No large effusion is evident. There is unchanged mild cardiomegaly.  IMPRESSION: New confluent airspace opacity in the right upper lobe. Ground-glass opacities elsewhere in the central and basilar lungs. This could represent congestive heart failure with asymmetric edema. Infectious infiltrate cannot be excluded   Electronically Signed   By: Ellery Plunkaniel R Mitchell M.D.   On: 10/13/2014 05:34   Dg Chest Port 1 View  10/10/2014   CLINICAL DATA:  Central line placement  EXAM: PORTABLE CHEST - 1 VIEW  COMPARISON:  10/09/2014  FINDINGS: There is a new right subclavian central line with tip in the SVC several cm below the azygos vein junction. There is no pneumothorax. The lungs are clear except for mild interstitial fluid or thickening.  IMPRESSION: Satisfactorily positioned right subclavian central line. No pneumothorax.   Electronically Signed   By: Ellery Plunkaniel R Mitchell M.D.   On: 10/10/2014 23:34   Dg Abd Portable 1v  10/13/2014   CLINICAL DATA:  Orogastric tube placement  EXAM: PORTABLE ABDOMEN - 1 VIEW  COMPARISON:  None.  FINDINGS: Orogastric tube tip and side port in stomach. There are loops of mildly dilated bowel in the mid abdomen. No free air seen on this semi-erect portable study.  IMPRESSION: Orogastric tube tip and  side port in stomach. Bowel gas pattern raises question of a degree of ileus or obstruction.   Electronically Signed   By: Bretta Bang III M.D.   On: 10/13/2014 12:40   US Abdomen Limited Ruq  10/11/2014   CLINICAL DATA:  ` right upper quadrant pain  EXAM: US ABDOMEN LIMITED - RIGHT UPPER QUADRANT  COMPARISON:  None.  FINDINGS: Gallbladder:   Multiple calculi are present in the gallbladder lumen. There is no gallbladder wall thickening. The patient was not tender over the gallbladder.  Common bile duct:  Diameter: 3.3 mm, normal  Liver:  There is diffusely coarsened echotexture of the liver without focal lesion. There is no intrahepatic bile duct dilatation.  IMPRESSION: Cholelithiasis. Diffusely coarsened hepatic parenchymal echotexture, nonspecific.   Electronically Signed   By: Ellery Plunk M.D.   On: 10/11/2014 01:23   Dg Fluoro Guide Lumbar Puncture  10/14/2014   CLINICAL DATA:  Fever and altered mental status  EXAM: DIAGNOSTIC LUMBAR PUNCTURE UNDER FLUOROSCOPIC GUIDANCE  FLUOROSCOPY TIME:  Radiation Exposure Index (as provided by the fluoroscopic device): 18.10 mGy entrance does  If the device does not provide the exposure index:  Fluoroscopy Time (in minutes and seconds):  0.8 minutes  Number of Acquired Images:  2  PROCEDURE: Informed consent was obtained from the patient's family the day before the procedure. With the patient prone, the lower back was prepped with Betadine. 1% Lidocaine was used for local anesthesia. Lumbar puncture was performed at aL4 laminectomy defect level using a 5 inch 20 gauge needle with return of clear CSF. Pressures could not be reliably obtained due to patient intubation status and prone positioning. 9 ml of CSF were obtained for laboratory studies. The patient tolerated the procedure well and there were no apparent complications.  IMPRESSION: Successful lumbar puncture with fluoroscopy.   Electronically Signed   By: Marnee Spring M.D.   On: 10/14/2014 14:11    CBC  Recent Labs Lab 10/20/14 0234 10/22/14 0228 10/23/14 0335 10/24/14 0235 10/26/14 0502  WBC 7.3 7.8 7.7  7.7 8.2 7.1  HGB 9.2* 8.6* 8.6*  8.6* 9.8* 9.8*  HCT 29.1* 27.3* 27.5*  27.4* 31.1* 31.1*  PLT 78* 71* 103*  103* 122* 164  MCV 87.1 86.9 86.5  86.4 87.4 87.9  MCH 27.5 27.4 27.0  27.1 27.5 27.7  MCHC 31.6 31.5 31.3   31.4 31.5 31.5  RDW 19.3* 19.9* 19.9*  20.0* 20.4* 20.9*    Chemistries   Recent Labs Lab 10/21/14 0222 10/22/14 0228 10/23/14 0335 10/24/14 0235 10/25/14 0418 10/26/14 0502  NA 141 142 140 137 139 139  K 3.8 3.5 3.6 3.7 3.2* 3.4*  CL 111 110 108 106 103 104  CO2 20* GLUCOSE 157* 123* 137* 139* 134* 120*  BUN 32* 31* 28* 22* 20 18  CREATININE 1.87* 1.82* 1.67* 1.53* 1.54* 1.59*  CALCIUM 7.8* 7.9* 7.8* 8.2* 8.3* 8.5*  MG 1.8 1.7  --   --   --   --   AST 70* 72* 82* 93*  --   --   ALT 54 51 55 62  --   --   ALKPHOS 120 115 123 137*  --   --   BILITOT 1.2 1.2 1.1 1.1  --   --    ------------------------------------------------------------------------------------------------------------------ estimated creatinine clearance is 50.3 mL/min (by C-G formula based on Cr of 1.59). ------------------------------------------------------------------------------------------------------------------ No results for input(s): HGBA1C in the last 72 hours. ------------------------------------------------------------------------------------------------------------------ No results  for input(s): CHOL, HDL, LDLCALC, TRIG, CHOLHDL, LDLDIRECT in the last 72 hours. ------------------------------------------------------------------------------------------------------------------ No results for input(s): TSH, T4TOTAL, T3FREE, THYROIDAB in the last 72 hours.  Invalid input(s): FREET3 ------------------------------------------------------------------------------------------------------------------ No results for input(s): VITAMINB12, FOLATE, FERRITIN, TIBC, IRON, RETICCTPCT in the last 72 hours.  Coagulation profile  Recent Labs Lab 10/23/14 0335  INR 1.39    No results for input(s): DDIMER in the last 72 hours.  Cardiac Enzymes No results for input(s): CKMB, TROPONINI, MYOGLOBIN in the last 168 hours.  Invalid input(s):  CK ------------------------------------------------------------------------------------------------------------------ Invalid input(s): POCBNP   Recent Labs  10/25/14 0808 10/25/14 1227 10/25/14 1652 10/25/14 2117 10/26/14 0741 10/26/14 1202  GLUCAP 126* 152* 140* 126* 126* 133*     Ari Bernabei M.D. [facility] 10/26/2014, 2:38 PM  Pager: [phone]   Between 7am to 7pm - call Pager - [phone]  After 7pm go to [email or web address] - password [email or web address]  Call night coverage person covering after 7pm

## 2014-10-26 NOTE — Consult Note (Signed)
[facility] [place] [facility] Consult   Reason for Consult:  Delirium with behavioral problems Referring Physician:  Dr. Tana Coast Patient Identification: Cody Schmitt MRN:  [id number] Principal Diagnosis: Acute delirium Diagnosis:   Patient Active Problem List   Diagnosis Date Noted  . Acute delirium [R41.0] 10/26/2014  . Acute pulmonary edema [J81.0]   . Essential hypertension [I10]   . Demand ischemia [I24.8]   . Chronic renal failure, stage 3 (moderate) [N18.3]   . Hx of fluid overload [Z86.39]   . Hepatitis C antibody test positive [R89.4]   . Diabetes type 2, controlled [E11.9]   . Hypernatremia [E87.0]   . Physical deconditioning [R53.81]   . Respiratory failure [J96.90]   . Severe sepsis [A41.9, R65.20]   . Aspiration pneumonia [J69.0]   . Acute encephalopathy [G93.40] 10/10/2014  . COPD (chronic obstructive pulmonary disease) [J44.9] 02/18/2013  . OSA (obstructive sleep apnea) [G47.33] 02/18/2013  . IDDM (insulin dependent diabetes mellitus) [E11.9, Z79.4] 02/18/2013  . DOE (dyspnea on exertion) [R06.09] 02/18/2013    Total Time spent with patient: 45 minutes  Subjective:   Cody Schmitt is a 80 y.o. male patient admitted with delirium and confusion.  HPI: Cody Schmitt is a 80 years old male admitted to [facility] [place] on Oct 10, 2014 with significant altered mental status from [facility]. Patient was intubated on 5/5 secondary to combative behaviors secondary to AMS. Patient was extubated on 5/8. His medical evaluation completed with Echo, EEG and MRI of brain which are normal. Patient continue to be confused but pleasant at this time. Patient laughs when he found himself that was confused and unable to recall basic information about where he lives etc. He knows his first and last name and knows he is in hospital. He states today date is May 25 and unable to respond to year. He is able to recognize three objects shown to him, able to point out with index finger way to  his room door and window. He knows he is living with his wife. He seems like has intact long term memory than immediate and delayed memory. He denied being depressed, anxious and psychosis. Patient has no behavioral problems this morning but know history of trying to get out of bed and trouble walking and using rest room etc. Patient has no physical complaints during this visit.   Past Psychiatric history: Patient has no known psych history or acute psych hospitalization  Social history: Patient currently living with his wife and has history of working in [employer], [employer].   HPI Elements:   Location:  delirium and confusion. Quality:  unable to care for himself. Severity:  severe. Timing:  unknown. Duration:  few weeks. Context:  unknown psychosocial issues..  Past Medical History:  Past Medical History  Diagnosis Date  . HTN (hypertension)   . Asthma   . Hepatitis C   . Dyslipidemia   . GERD (gastroesophageal reflux disease)   . Diabetes mellitus without complication     Past Surgical History  Procedure Laterality Date  . Colonoscopy    . Cataract surgery     Family History:  Family History  Problem Relation Age of Onset  . Hypertension Mother   . Hypertension Father    Social History:  History  Alcohol Use: Not on file     History  Drug Use Not on file    History   Social History  . Marital Status: Married    Spouse Name: N/A  .  Number of Children: N/A  . Years of Education: N/A   Social History Main Topics  . Smoking status: Former Smoker -- 0.50 packs/day for 20 years    Types: Cigarettes    Quit date: 06/10/1982  . Smokeless tobacco: Not on file  . Alcohol Use: Not on file  . Drug Use: Not on file  . Sexual Activity: Not on file   Other Topics Concern  . None   Social History Narrative   Additional Social History:                          Allergies:   Allergies  Allergen Reactions  . Lisinopril Cough  . Statins  Nausea And Vomiting  . Zetia [Ezetimibe] Nausea And Vomiting  . Coumadin [Warfarin Sodium] Rash  . Dilaudid [Hydromorphone Hcl] Itching and Rash    Labs:  Results for orders placed or performed during the hospital encounter of 10/10/14 (from the past 48 hour(s))  Glucose, capillary     Status: Abnormal   Collection Time: 10/24/14 11:19 AM  Result Value Ref Range   Glucose-Capillary 153 (H) 65 - 99 mg/dL  Glucose, capillary     Status: Abnormal   Collection Time: 10/24/14  2:13 PM  Result Value Ref Range   Glucose-Capillary 141 (H) 65 - 99 mg/dL  Glucose, capillary     Status: Abnormal   Collection Time: 10/24/14  4:23 PM  Result Value Ref Range   Glucose-Capillary 142 (H) 65 - 99 mg/dL  Glucose, capillary     Status: Abnormal   Collection Time: 10/24/14  8:53 PM  Result Value Ref Range   Glucose-Capillary 135 (H) 65 - 99 mg/dL   Comment 1 Notify RN    Comment 2 Document in Chart   Ammonia     Status: Abnormal   Collection Time: 10/25/14  4:17 AM  Result Value Ref Range   Ammonia 50 (H) 9 - 35 umol/L  Renal function panel     Status: Abnormal   Collection Time: 10/25/14  4:18 AM  Result Value Ref Range   Sodium 139 135 - 145 mmol/L   Potassium 3.2 (L) 3.5 - 5.1 mmol/L   Chloride 103 101 - 111 mmol/L   CO2 23 22 - 32 mmol/L   Glucose, Bld 134 (H) 65 - 99 mg/dL   BUN 20 6 - 20 mg/dL   Creatinine, Ser 1.54 (H) 0.61 - 1.24 mg/dL   Calcium 8.3 (L) 8.9 - 10.3 mg/dL   Phosphorus 3.7 2.5 - 4.6 mg/dL   Albumin 2.0 (L) 3.5 - 5.0 g/dL   GFR calc non Af Amer 42 (L) >60 mL/min   GFR calc Af Amer 48 (L) >60 mL/min    Comment: (NOTE) The eGFR has been calculated using the CKD EPI equation. This calculation has not been validated in all clinical situations. eGFR's persistently <60 mL/min signify possible Chronic Kidney Disease.    Anion gap 13 5 - 15  Glucose, capillary     Status: Abnormal   Collection Time: 10/25/14  8:08 AM  Result Value Ref Range   Glucose-Capillary 126 (H)  65 - 99 mg/dL  Glucose, capillary     Status: Abnormal   Collection Time: 10/25/14 12:27 PM  Result Value Ref Range   Glucose-Capillary 152 (H) 65 - 99 mg/dL  Glucose, capillary     Status: Abnormal   Collection Time: 10/25/14  4:52 PM  Result Value Ref Range   Glucose-Capillary  140 (H) 65 - 99 mg/dL  Glucose, capillary     Status: Abnormal   Collection Time: 10/25/14  9:17 PM  Result Value Ref Range   Glucose-Capillary 126 (H) 65 - 99 mg/dL  Basic metabolic panel     Status: Abnormal   Collection Time: 10/26/14  5:02 AM  Result Value Ref Range   Sodium 139 135 - 145 mmol/L   Potassium 3.4 (L) 3.5 - 5.1 mmol/L   Chloride 104 101 - 111 mmol/L   CO2 26 22 - 32 mmol/L   Glucose, Bld 120 (H) 65 - 99 mg/dL   BUN 18 6 - 20 mg/dL   Creatinine, Ser 1.59 (H) 0.61 - 1.24 mg/dL   Calcium 8.5 (L) 8.9 - 10.3 mg/dL   GFR calc non Af Amer 40 (L) >60 mL/min   GFR calc Af Amer 47 (L) >60 mL/min    Comment: (NOTE) The eGFR has been calculated using the CKD EPI equation. This calculation has not been validated in all clinical situations. eGFR's persistently <60 mL/min signify possible Chronic Kidney Disease.    Anion gap 9 5 - 15  CBC     Status: Abnormal   Collection Time: 10/26/14  5:02 AM  Result Value Ref Range   WBC 7.1 4.0 - 10.5 K/uL   RBC 3.54 (L) 4.22 - 5.81 MIL/uL   Hemoglobin 9.8 (L) 13.0 - 17.0 g/dL   HCT 31.1 (L) 39.0 - 52.0 %   MCV 87.9 78.0 - 100.0 fL   MCH 27.7 26.0 - 34.0 pg   MCHC 31.5 30.0 - 36.0 g/dL   RDW 20.9 (H) 11.5 - 15.5 %   Platelets 164 150 - 400 K/uL  Glucose, capillary     Status: Abnormal   Collection Time: 10/26/14  7:41 AM  Result Value Ref Range   Glucose-Capillary 126 (H) 65 - 99 mg/dL    Vitals: Blood pressure 129/49, pulse 78, temperature 98.5 F (36.9 C), temperature source Oral, resp. rate 18, height _0  (1.778 m), weight 119.1 kg (262 lb 9.1 oz), SpO2 93 %.  Risk to Self: Is patient at risk for suicide?: No Risk to Others:   Prior  Inpatient Therapy:   Prior Outpatient Therapy:    Current Facility-Administered Medications  Medication Dose Route Frequency Provider Last Rate Last Dose  . arformoterol (BROVANA) nebulizer solution 15 mcg  15 mcg Nebulization BID Corey Harold, NP   15 mcg at 10/26/14 0845  . aspirin EC tablet 81 mg  81 mg Oral Daily Chesley Mires, MD   81 mg at 10/26/14 5456  . budesonide (PULMICORT) nebulizer solution 0.25 mg  0.25 mg Nebulization BID Corey Harold, NP   0.25 mg at 10/25/14 2014  . feeding supplement (GLUCERNA SHAKE) (GLUCERNA SHAKE) liquid 237 mL  237 mL Oral BID BM Ardeen Garland, RD   237 mL at 10/26/14 0927  . furosemide (LASIX) injection 80 mg  80 mg Intravenous BID Delfina Redwood, MD   80 mg at 10/26/14 2563  . heparin injection 5,000 Units  5,000 Units Subcutaneous 3 times per day Corey Harold, NP   5,000 Units at 10/26/14 8937  . hydrALAZINE (APRESOLINE) injection 10-40 mg  10-40 mg Intravenous Q4H PRN Corey Harold, NP      . insulin aspart (novoLOG) injection 0-15 Units  0-15 Units Subcutaneous TID WC Wilhelmina Mcardle, MD   2 Units at 10/26/14 [phone]  . insulin aspart (novoLOG) injection 0-5 Units  0-5  Units Subcutaneous QHS Wilhelmina Mcardle, MD   2 Units at 10/20/14 2200  . lactulose (CHRONULAC) 10 GM/15ML solution 40 g  40 g Oral TID Ripudeep Krystal Eaton, MD   40 g at 10/26/14 0927  . metoprolol succinate (TOPROL-XL) 24 hr tablet 25 mg  25 mg Oral Daily Delfina Redwood, MD   25 mg at 10/26/14 1121  . pantoprazole (PROTONIX) EC tablet 40 mg  40 mg Oral Q1200 Wilhelmina Mcardle, MD   40 mg at 10/26/14 6244  . rifaximin (XIFAXAN) tablet 550 mg  550 mg Oral BID Delfina Redwood, MD   550 mg at 10/26/14 6950  . spironolactone (ALDACTONE) tablet 50 mg  50 mg Oral BID Delfina Redwood, MD   50 mg at 10/26/14 7225    Musculoskeletal: Strength & Muscle Tone: decreased Gait & Station: unable to stand Patient leans: N/A  Psychiatric Specialty Exam: Physical Exam  ROS not done due  to his current mental status  Blood pressure 129/49, pulse 78, temperature 98.5 F (36.9 C), temperature source Oral, resp. rate 18, height _0  (1.778 m), weight 119.1 kg (262 lb 9.1 oz), SpO2 93 %.Body mass index is 37.67 kg/(m^2).  General Appearance: Casual, pleasantly confused  Eye Contact::  Good  Speech:  Blocked and Slow  Volume:  Decreased  Mood:  Anxious  Affect:  Appropriate and Congruent  Thought Process:  Loose  Orientation:  Full (Time, Place, and Person) but not for his situation  Thought Content:  Rumination  Suicidal Thoughts:  No  Homicidal Thoughts:  No  Memory:  Immediate;   Fair Recent;   Poor  Judgement:  Poor  Insight:  Lacking  Psychomotor Activity:  Decreased  Concentration:  Poor  Recall:  Poor  Fund of Knowledge:Fair  Language: Good  Akathisia:  Negative  Handed:  Right  AIMS (if indicated):     Assets:  Agricultural consultant Housing Intimacy Leisure Time Resilience Social Support  ADL's:  Impaired  Cognition: Impaired,  Moderate  Sleep:      Medical Decision Making: New problem, with additional work up planned, Review of Psycho-Social Stressors (1), Review or order clinical lab tests (1), Discuss test with performing physician (1), Review of Last Therapy Session (1), Review or order medicine tests (1), Review of Medication Regimen & Side Effects (2) and Review of New Medication or Change in Dosage (2)  Treatment Plan Summary: Daily contact with patient to assess and evaluate symptoms and progress in treatment and Medication management  Plan: Patient does not meet criteria for capacity to make his own medical decisions or living arrangements  Agitation or combative behaviors: Seroquel 25 mg TID/PRN, which helped earlier   Patient does not meet criteria for psychiatric inpatient admission. Supportive therapy provided about ongoing stressors.  May contact psych social service if needed information about  obtaining MCPOA  Disposition: Patient benefit from out of home placement to SNF with rehab available due to lack of capacity to care for himself and ongoing confusion.   Cody Schmitt,JANARDHAHA R. 10/26/2014 9:37 AM

## 2014-10-27 DIAGNOSIS — J9811 Atelectasis: Secondary | ICD-10-CM | POA: Insufficient documentation

## 2014-10-27 LAB — BASIC METABOLIC PANEL
ANION GAP: 10 (ref 5–15)
BUN: 17 mg/dL (ref 6–20)
CALCIUM: 8.2 mg/dL — AB (ref 8.9–10.3)
CO2: 27 mmol/L (ref 22–32)
Chloride: 104 mmol/L (ref 101–111)
Creatinine, Ser: 1.46 mg/dL — ABNORMAL HIGH (ref 0.61–1.24)
GFR calc Af Amer: 52 mL/min — ABNORMAL LOW (ref >60)
GFR, EST NON AFRICAN AMERICAN: 45 mL/min — AB (ref >60)
Glucose, Bld: 119 mg/dL — ABNORMAL HIGH (ref 65–99)
Potassium: 3.3 mmol/L — ABNORMAL LOW (ref 3.5–5.1)
SODIUM: 141 mmol/L (ref 135–145)

## 2014-10-27 LAB — GLUCOSE, CAPILLARY
GLUCOSE-CAPILLARY: 164 mg/dL — AB (ref 65–99)
GLUCOSE-CAPILLARY: 126 mg/dL — AB (ref 65–99)
Glucose-Capillary: 118 mg/dL — ABNORMAL HIGH (ref 65–99)
Glucose-Capillary: 120 mg/dL — ABNORMAL HIGH (ref 65–99)
Glucose-Capillary: 149 mg/dL — ABNORMAL HIGH (ref 65–99)

## 2014-10-27 MED ORDER — POTASSIUM CHLORIDE CRYS ER 20 MEQ PO TBCR
40.0000 meq | EXTENDED_RELEASE_TABLET | Freq: Once | ORAL | Status: AC
  Administered 2014-10-27: 40 meq via ORAL
  Filled 2014-10-27: qty 2

## 2014-10-27 NOTE — Progress Notes (Signed)
Pt's CPAP on hold due to confusion. Will continue to assess

## 2014-10-27 NOTE — Progress Notes (Signed)
Patient remains very confused agitated sitter at bedside for safety.Patient received prn dose of seroquel tonight confusion and agitation unchanged.Will continue to monitor patient.

## 2014-10-27 NOTE — Clinical Social Work Note (Signed)
CSW spoke with patient's wife today regarding disposition. CSW explained to wife that MD may be ready to DC patient  tomorrow and has encouraged the wife to decide on a SNF by tomorrow in case this happens. The patient's wife will tour the facilities today and make decision tomorrow. CSW will continue to assist.   Roddie McBryant Cope Marte MSW, [place], [place], [phone]

## 2014-10-27 NOTE — Progress Notes (Addendum)
[facility]                                                                              Patient Demographics  Cody Schmitt, is a 80 y.o. male, DOB - 10/24/1934, WJX:[id number]  Admit date - 10/10/2014   Admitting Physician Lupita Leashouglas B McQuaid, MD  Outpatient Primary MD for the patient is VYAS,DHRUV B., MD  LOS - 17   No chief complaint on file.      Brief HPI   The patient is a 80 year old male with hypertension, asthma, hepatitis C, dyslipidemia, GERD, diabetes, had trouble walking for the past 3 months, was noted to have altered mental status 5/1. [facility] and was agitated requiring several doses of Ativan. In ED, patient was noted to be febrile with elevated leukocytosis but no obvious source of infection. LP was attempted in ED and was unsuccessful. Patient [facility] for ICU admission and further workup. CT head was negative for acute intracranial issues. Patient remained agitated at the time of admission and was placed on Precedex drip and in restraints. Neuro was consulted. EEG showed generalized slowing but no seizure focus, MRI of the brain was normal. 2-D echocardiogram showed mild-to-moderate LVH, EF 55-60%, right upper quadrant ultrasound showed cholelithiasis with diffuse coarsening of the hepatic parenchymal structure. Patient's agitation worsened off the Precedex and had to take coarse secretions and acute respiratory failure standard respiration patient was intubated on 5/5. LP was performed on 5/6,  LP > glucose 83, protein 133, RBC 251, WBC 1 Patient was extubated on 5/8 however remained on Precedex. Patient was finally weaned off Precedex on 5/11 and placed on IV diuresis for severe volume overload. Dopplers of the left leg showed no DVT. Patient was transferred to stepdown on 5/11 and transferred to hospitalist service on 5/12. Patient continues to have waxing and waning mental status, has sitter.       Assessment &  Plan    Principal Problem: Acute encephalopathy, likely due to hepatic encephalopathy, possibly hepatic steatosis  - Patient had extensive workup during this admission including spinal tap, MRI of the brain, EEG, all negative - He was briefly placed on Seroquel which was discontinued due to no improved affect, patient however remains agitated - Continue lactulose, check ammonia level in a.m. Continue rifaximin  - Patient had a right upper quadrant ultrasound on 5/3 which showed cholelithiasis with diffusely coarsened hepatic parenchymal echotexture, possibly due to cirrhosis  Activity problems Acute respiratory failure / Severe sepsis / aspiration pneumonia? -Extubated, currently O2 sats 99% on room air, resolved  Volume overload/lower extremity edema: Improving -Continue IV Lasix with potassium replacement as needed - Good response to diuresis, negative balance of 5.7 L. Will transition to oral Lasix tomorrow  Obstructive sleep apnea -CPAP QHS   HTN -Currently stable, continue Lasix, metoprolol  NSTEMI vs demand ischemia -hemodynamically stable - troponin appears to have peaked at 0.17 -Continue ASA 81 mg daily  Mild acute on chronic CKD stage 3 w/ fluid overload  -Creatinine improving, 1.4 today, follow closely with diureses   Anemia without acute blood loss -Hemoglobin stable, 9.8,  transfuse if hemoglobin less than 8  Thrombocytopenia suspect chronic and related to liver disease - improving  DM 2 -CBG is currently fairly controlled, continue sliding scale insulin    Code Status: full code  Family Communication: Discussed in detail with the patient, all imaging results, lab results explained to the patient's wife on the phone. No family member at the bedside at the time of examination.   Disposition Plan: Not medically stable  Time Spent in minutes   Procedures  intubation  Consults   Pulmonology  Psychiatry Neurology  DVT Prophylaxis    heparin Medications  Scheduled Meds: . arformoterol  15 mcg Nebulization BID  . aspirin EC  81 mg Oral Daily  . budesonide  0.25 mg Nebulization BID  . feeding supplement (GLUCERNA SHAKE)  237 mL Oral BID BM  . furosemide  80 mg Intravenous BID  . heparin  5,000 Units Subcutaneous 3 times per day  . insulin aspart  0-15 Units Subcutaneous TID WC  . insulin aspart  0-5 Units Subcutaneous QHS  . lactulose  30 g Oral TID  . metoprolol succinate  25 mg Oral Daily  . pantoprazole  40 mg Oral Q1200  . rifaximin  550 mg Oral BID  . spironolactone  50 mg Oral BID   Continuous Infusions:  PRN Meds:.hydrALAZINE, QUEtiapine   Antibiotics   Anti-infectives    Start     Dose/Rate Route Frequency Ordered Stop   10/25/14 1300  rifaximin (XIFAXAN) tablet 550 mg     550 mg Oral 2 times daily 10/25/14 1255     10/18/14 1100  ceFEPIme (MAXIPIME) 2 g in dextrose 5 % 50 mL IVPB     2 g 100 mL/hr over 30 Minutes Intravenous Every 24 hours 10/18/14 1050 10/18/14 1707   10/14/14 1100  ceFEPIme (MAXIPIME) 2 g in dextrose 5 % 50 mL IVPB  Status:  Discontinued     2 g 100 mL/hr over 30 Minutes Intravenous Every 12 hours 10/14/14 0951 10/18/14 1050   10/11/14 0000  vancomycin (VANCOCIN) 1,500 mg in sodium chloride 0.9 % 500 mL IVPB  Status:  Discontinued     1,500 mg 250 mL/hr over 120 Minutes Intravenous Every 24 hours 10/10/14 1814 10/17/14 1141   10/10/14 2000  acyclovir (ZOVIRAX) 615 mg in dextrose 5 % 100 mL IVPB  Status:  Discontinued     10 mg/kg  61.4 kg (Ideal) 112.3 mL/hr over 60 Minutes Intravenous Every 8 hours 10/10/14 1814 10/11/14 1027   10/10/14 1900  ceFEPIme (MAXIPIME) 2 g in dextrose 5 % 50 mL IVPB  Status:  Discontinued     2 g 100 mL/hr over 30 Minutes Intravenous  Once 10/10/14 1747 10/10/14 1814   10/10/14 1900  ceFEPIme (MAXIPIME) 2 g in dextrose 5 % 50 mL IVPB  Status:  Discontinued     2 g 100 mL/hr over 30 Minutes Intravenous Every 24 hours 10/10/14 1814 10/14/14 1610         Subjective:   Cody Schmitt was seen and examined today. Alert and oriented to self, able to state some responses correctly. Patient denies any chest pain and shortness of breath or abdominal pain. No fevers or chills. Afebrile, sitter in the room.   Objective:   Blood pressure 125/51, pulse 80, temperature 98.6 F (37 C), temperature source Oral, resp. rate 20, height 5\' 10"  (1.778 m), weight 116 kg (255 lb 11.7 oz), SpO2 98 %.  Wt Readings from Last 3 Encounters:  10/27/14 116 kg (255 lb 11.7 oz)  02/18/13 121.927 kg (268 lb 12.8 oz)     Intake/Output Summary (Last 24 hours) at 10/27/14 1338 Last data filed at 10/27/14 1229  Gross per 24 hour  Intake    600 ml  Output   1853 ml  Net  -1253 ml    Exam  General: Alert and oriented x 1/self, NAD  HEENT:  PERRLA, EOMI,  Neck: Supple, no JVD, no masses  CVS: S1 S2 clear, RRR  Respiratory: CTAB  Abdomen: Soft, nontender, nondistended, + bowel sounds  Ext: no cyanosis clubbing, 1+ edema  Neuro: Strength 5/5 upper and lower extremities bilaterally  Skin: No rashes  Psych: Normal affect and demeanor, alert and oriented x1    Data Review   Micro Results Recent Results (from the past 240 hour(s))  Culture, Urine     Status: None   Collection Time: 10/21/14  1:59 AM  Result Value Ref Range Status   Specimen Description URINE, RANDOM  Final   Special Requests NONE  Final   Colony Count   Final    45,000 COLONIES/ML Performed at [facility]    Culture YEAST Performed at [facility]   Final   Report Status 10/22/2014 FINAL  Final    Radiology Reports Mr Laqueta Jean Wo Contrast  10/14/2014   CLINICAL DATA:  Altered mental status after taking a nap, RIGHT upper quadrant pain. Assess acute encephalopathy. History of hepatitis-C, diabetes.  EXAM: MRI HEAD WITHOUT AND WITH CONTRAST  TECHNIQUE: Multiplanar, multiecho pulse sequences of the brain and surrounding structures were obtained without  and with intravenous contrast.  CONTRAST:  20mL MULTIHANCE GADOBENATE DIMEGLUMINE 529 MG/ML IV SOLN  COMPARISON:  CT of the head Oct 09, 2014  FINDINGS: The ventricles and sulci are normal for patient's age. Patchy supratentorial white matter T2 hyperintensities are within normal range for patient's age. No abnormal parenchymal signal, mass lesions, mass effect. No abnormal parenchymal enhancement though coronal motion degraded sequences. No reduced diffusion to suggest acute ischemia. No susceptibility artifact to suggest hemorrhage.  No abnormal extra-axial fluid collections. No extra-axial masses nor leptomeningeal enhancement. Normal major intracranial vascular flow voids seen at the skull base.  Ocular globes and orbital contents are normal though not tailored for evaluation ; bilateral ocular lens implants. No abnormal sellar expansion. Trace paranasal sinus mucosal thickening, trace mastoid effusions. No suspicious calvarial bone marrow signal. Craniocervical junction maintained.  IMPRESSION: Normal mildly motion degraded MRI of the brain with and without contrast for age.   Electronically Signed   By: Awilda Metro   On: 10/14/2014 00:50   Dg Chest Port 1 View  10/20/2014   CLINICAL DATA:  Respiratory failure.  Shortness of breath.  EXAM: PORTABLE CHEST - 1 VIEW  COMPARISON:  10/19/2014.  FINDINGS: Mediastinum hilar structures are normal. Cardiomegaly with bilateral pulmonary infiltrates particularly prominent throughout the right lung. No significant change. Small pleural effusions cannot be excluded. No pneumothorax.  IMPRESSION: Persistent cardiomegaly with bilateral pulmonary infiltrates particularly throughout the right lung consistent with congestive heart failure with pulmonary edema. Bilateral pneumonia could also present in this fashion. Associated small pleural effusions are present. Chest is unchanged from prior exam.   Electronically Signed   By: Maisie Fus  Register   On: 10/20/2014 07:06    Dg Chest Port 1 View  10/19/2014   CLINICAL DATA:  Respiratory failure  EXAM: PORTABLE CHEST - 1 VIEW  COMPARISON:  10/18/2014  FINDINGS: Cardiac shadow is stable.  Diffuse right-sided infiltrate is again noted. Vascular congestion and interstitial edema is seen which is new from the prior study. The left lung show slow lower lobe atelectatic changes.  IMPRESSION: Increasing central vascular congestion with pulmonary edema.  Stable infiltrative changes on the right.  New left basilar atelectasis.   Electronically Signed   By: Alcide CleverMark  Lukens M.D.   On: 10/19/2014 07:45   Dg Chest Port 1 View  10/18/2014   CLINICAL DATA:  Respiratory failure.  EXAM: PORTABLE CHEST - 1 VIEW  COMPARISON:  10/17/2014.  FINDINGS: Interim removal right subclavian line. Mediastinum and hilar structures are normal. Stable diffuse right lung infiltrate is present. Mild left base subsegmental atelectasis scratched is present . No pleural effusion or pneumothorax. Cardiomegaly with normal pulmonary vascularity.  IMPRESSION: 1. Diffuse right lung infiltrate again noted. No interim improvement. 2. Mild left base subsegmental atelectasis. 3. Stable cardiomegaly.   Electronically Signed   By: Maisie Fushomas  Register   On: 10/18/2014 07:28   Dg Chest Port 1 View  10/17/2014   CLINICAL DATA:  Aspiration pneumonia.  EXAM: PORTABLE CHEST - 1 VIEW  COMPARISON:  10/16/2014.  FINDINGS: Interim removal of endotracheal tube and NG tube. Right subclavian line stable position. Progressive diffuse right lung infiltrate. Mild left lower lobe infiltrate again noted. Stable cardiomegaly. Normal pulmonary vascularity. Small right pleural effusion noted. No pneumothorax. No acute bony abnormality.  IMPRESSION: 1.  Right subclavian line in stable position.  2. Progressive diffuse right lung infiltrate. Persistent left lower lobe infiltrate. Small right pleural effusion.   Electronically Signed   By: Maisie Fushomas  Register   On: 10/17/2014 07:29   Dg Chest Port 1  View  10/16/2014   CLINICAL DATA:  Followup aspiration pneumonia.  Intubated.  EXAM: PORTABLE CHEST - 1 VIEW  COMPARISON:  Yesterday.  FINDINGS: Endotracheal tube in satisfactory position. Nasogastric tube extending into the stomach. Right subclavian catheter tip in the superior vena cava. Stable enlarged cardiac silhouette and mildly prominent pulmonary vasculature. Decreased bilateral airspace opacity. No definite pleural fluid. Thoracic spine degenerative changes.  IMPRESSION: 1. Improving bilateral alveolar edema or pneumonia. 2. Stable cardiomegaly.   Electronically Signed   By: Beckie SaltsSteven  Reid M.D.   On: 10/16/2014 08:28   Dg Chest Port 1 View  10/15/2014   CLINICAL DATA:  Aspiration pneumonia  EXAM: PORTABLE CHEST - 1 VIEW  COMPARISON:  10/14/2014  FINDINGS: Endotracheal tube tip is 7.9 cm above the carina. Nasogastric tube extends below the diaphragm and off the inferior edge of the image. There is a right subclavian central line with tip in the SVC. Right upper lobe consolidation is slightly improved, now with less densely confluent consolidation. There is unchanged mild airspace opacity in both bases. No large effusions are evident.  IMPRESSION: Partial clearance of right upper lobe consolidation. Support equipment appears satisfactorily positioned.   Electronically Signed   By: Ellery Plunkaniel R Mitchell M.D.   On: 10/15/2014 07:10   Dg Chest Port 1 View  10/14/2014   CLINICAL DATA:  80 year old male with a history of aspiration pneumonia.  EXAM: PORTABLE CHEST - 1 VIEW  COMPARISON:  10/13/2014  FINDINGS: Persisting right-sided airspace disease with low lung volumes. Persisting retrocardiac opacity.  Cardiomediastinal silhouette not well evaluated.  Endotracheal tube terminates approximately 1 cm from the carina. This appears to have been advanced from the comparison.  Unchanged right subclavian central catheter which appears to terminate superior vena cava.  Enteric tube projects over the mediastinum,  terminating out of the field of  view.  IMPRESSION: Low lung volumes with persisting right-sided airspace disease.  Endotracheal tube terminates proximally 1 cm from the carina. This may be withdrawn 4 cm to 5 cm for better position.  These results were called by telephone at the time of interpretation on 10/14/2014 at 7:02 am to the nurse caring for the patient, Ms. Felicia Flynt who verbally acknowledged these results.  Unchanged right subclavian central line and enteric tube.  Signed,  Yvone Neu. Loreta Ave, DO  [facility]  [facility]   Electronically Signed   By: Gilmer Mor D.O.   On: 10/14/2014 07:04   Dg Chest Port 1 View  10/13/2014   CLINICAL DATA:  Hypoxia  EXAM: PORTABLE CHEST - 1 VIEW  COMPARISON:  Study obtained earlier in the day  FINDINGS: Endotracheal tube tip is 3.3 cm above the carina. Nasogastric tube tip and side port are below the diaphragm. Central catheter tip is in the superior vena cava. No pneumothorax. There is persistent airspace consolidation throughout much of the right lung, stable. There is patchy atelectatic type change in the left base. No new opacity compared to earlier in the day. Heart is upper normal in size with pulmonary vascularity within normal limits. No adenopathy. There is degenerative change in the thoracic spine.  IMPRESSION: Tube and catheter positions as described without pneumothorax. Stable extensive airspace opacification on the right. Patchy atelectasis left base. No new opacity compared to earlier in the day. No change in cardiac silhouette.   Electronically Signed   By: Bretta Bang III M.D.   On: 10/13/2014 12:39   Dg Chest Port 1 View  10/13/2014   CLINICAL DATA:  Dyspnea  EXAM: PORTABLE CHEST - 1 VIEW  COMPARISON:  10/10/2014  FINDINGS: There is a right subclavian central line with tip in the SVC. There is no pneumothorax. There is new confluent right upper lobe consolidation. This could represent pneumonia.  However, it could also represent asymmetric pulmonary edema as there are moderate congestive changes in the vasculature and interstitium. No large effusion is evident. There is unchanged mild cardiomegaly.  IMPRESSION: New confluent airspace opacity in the right upper lobe. Ground-glass opacities elsewhere in the central and basilar lungs. This could represent congestive heart failure with asymmetric edema. Infectious infiltrate cannot be excluded   Electronically Signed   By: Ellery Plunk M.D.   On: 10/13/2014 05:34   Dg Chest Port 1 View  10/10/2014   CLINICAL DATA:  Central line placement  EXAM: PORTABLE CHEST - 1 VIEW  COMPARISON:  10/09/2014  FINDINGS: There is a new right subclavian central line with tip in the SVC several cm below the azygos vein junction. There is no pneumothorax. The lungs are clear except for mild interstitial fluid or thickening.  IMPRESSION: Satisfactorily positioned right subclavian central line. No pneumothorax.   Electronically Signed   By: Ellery Plunk M.D.   On: 10/10/2014 23:34   Dg Abd Portable 1v  10/13/2014   CLINICAL DATA:  Orogastric tube placement  EXAM: PORTABLE ABDOMEN - 1 VIEW  COMPARISON:  None.  FINDINGS: Orogastric tube tip and side port in stomach. There are loops of mildly dilated bowel in the mid abdomen. No free air seen on this semi-erect portable study.  IMPRESSION: Orogastric tube tip and side port in stomach. Bowel gas pattern raises question of a degree of ileus or obstruction.   Electronically Signed   By: Bretta Bang III M.D.   On: 10/13/2014 12:40   US  Abdomen Limited Ruq  10/11/2014   CLINICAL DATA:  ` right upper quadrant pain  EXAM: US ABDOMEN LIMITED - RIGHT UPPER QUADRANT  COMPARISON:  None.  FINDINGS: Gallbladder:  Multiple calculi are present in the gallbladder lumen. There is no gallbladder wall thickening. The patient was not tender over the gallbladder.  Common bile duct:  Diameter: 3.3 mm, normal  Liver:  There is diffusely  coarsened echotexture of the liver without focal lesion. There is no intrahepatic bile duct dilatation.  IMPRESSION: Cholelithiasis. Diffusely coarsened hepatic parenchymal echotexture, nonspecific.   Electronically Signed   By: Ellery Plunk M.D.   On: 10/11/2014 01:23   Dg Fluoro Guide Lumbar Puncture  10/14/2014   CLINICAL DATA:  Fever and altered mental status  EXAM: DIAGNOSTIC LUMBAR PUNCTURE UNDER FLUOROSCOPIC GUIDANCE  FLUOROSCOPY TIME:  Radiation Exposure Index (as provided by the fluoroscopic device): 18.10 mGy entrance does  If the device does not provide the exposure index:  Fluoroscopy Time (in minutes and seconds):  0.8 minutes  Number of Acquired Images:  2  PROCEDURE: Informed consent was obtained from the patient's family the day before the procedure. With the patient prone, the lower back was prepped with Betadine. 1% Lidocaine was used for local anesthesia. Lumbar puncture was performed at aL4 laminectomy defect level using a 5 inch 20 gauge needle with return of clear CSF. Pressures could not be reliably obtained due to patient intubation status and prone positioning. 9 ml of CSF were obtained for laboratory studies. The patient tolerated the procedure well and there were no apparent complications.  IMPRESSION: Successful lumbar puncture with fluoroscopy.   Electronically Signed   By: Marnee Spring M.D.   On: 10/14/2014 14:11    CBC  Recent Labs Lab 10/22/14 0228 10/23/14 0335 10/24/14 0235 10/26/14 0502  WBC 7.8 7.7  7.7 8.2 7.1  HGB 8.6* 8.6*  8.6* 9.8* 9.8*  HCT 27.3* 27.5*  27.4* 31.1* 31.1*  PLT 71* 103*  103* 122* 164  MCV 86.9 86.5  86.4 87.4 87.9  MCH 27.4 27.0  27.1 27.5 27.7  MCHC 31.5 31.3  31.4 31.5 31.5  RDW 19.9* 19.9*  20.0* 20.4* 20.9*    Chemistries   Recent Labs Lab 10/21/14 0222 10/22/14 0228 10/23/14 0335 10/24/14 0235 10/25/14 0418 10/26/14 0502 10/27/14 0600  NA 141 142 140 137 139 139 141  K 3.8 3.5 3.6 3.7 3.2* 3.4* 3.3*  CL  111 110 108 106 103 104 104  CO2 20* GLUCOSE 157* 123* 137* 139* 134* 120* 119*  BUN 32* 31* 28* 22* CREATININE 1.87* 1.82* 1.67* 1.53* 1.54* 1.59* 1.46*  CALCIUM 7.8* 7.9* 7.8* 8.2* 8.3* 8.5* 8.2*  MG 1.8 1.7  --   --   --   --   --   AST 70* 72* 82* 93*  --   --   --   ALT 54 51 55 62  --   --   --   ALKPHOS 120 115 123 137*  --   --   --   BILITOT 1.2 1.2 1.1 1.1  --   --   --    ------------------------------------------------------------------------------------------------------------------ estimated creatinine clearance is 54.1 mL/min (by C-G formula based on Cr of 1.46). ------------------------------------------------------------------------------------------------------------------ No results for input(s): HGBA1C in the last 72 hours. ------------------------------------------------------------------------------------------------------------------ No results for input(s): CHOL, HDL, LDLCALC, TRIG, CHOLHDL, LDLDIRECT in the last 72 hours. ------------------------------------------------------------------------------------------------------------------ No results for input(s): TSH, T4TOTAL, T3FREE,  THYROIDAB in the last 72 hours.  Invalid input(s): FREET3 ------------------------------------------------------------------------------------------------------------------ No results for input(s): VITAMINB12, FOLATE, FERRITIN, TIBC, IRON, RETICCTPCT in the last 72 hours.  Coagulation profile  Recent Labs Lab 10/23/14 0335  INR 1.39    No results for input(s): DDIMER in the last 72 hours.  Cardiac Enzymes No results for input(s): CKMB, TROPONINI, MYOGLOBIN in the last 168 hours.  Invalid input(s): CK ------------------------------------------------------------------------------------------------------------------ Invalid input(s): POCBNP   Recent Labs  10/26/14 0741 10/26/14 1202 10/26/14 1652 10/26/14 2118 10/27/14 0812 10/27/14 1208   GLUCAP 126* 133* 122* 118* 120* 164*     RAI,RIPUDEEP M.D. [facility] 10/27/2014, 1:38 PM  Pager: [phone]   Between 7am to 7pm - call Pager - [phone]  After 7pm go to [email or web address] - password [email or web address]  Call night coverage person covering after 7pm

## 2014-10-27 NOTE — Consult Note (Signed)
Psychiatry Consult follow-up  Reason for Consult:  Delirium with behavioral problems Referring Physician:  Dr. Tana Coast Patient Identification: Cody Schmitt MRN:  [id number] Principal Diagnosis: Acute delirium Diagnosis:   Patient Active Problem List   Diagnosis Date Noted  . Acute delirium [R41.0] 10/26/2014  . Acute pulmonary edema [J81.0]   . Essential hypertension [I10]   . Demand ischemia [I24.8]   . Chronic renal failure, stage 3 (moderate) [N18.3]   . Hx of fluid overload [Z86.39]   . Hepatitis C antibody test positive [R89.4]   . Diabetes type 2, controlled [E11.9]   . Hypernatremia [E87.0]   . Physical deconditioning [R53.81]   . Respiratory failure [J96.90]   . Severe sepsis [A41.9, R65.20]   . Aspiration pneumonia [J69.0]   . Acute encephalopathy [G93.40] 10/10/2014  . COPD (chronic obstructive pulmonary disease) [J44.9] 02/18/2013  . OSA (obstructive sleep apnea) [G47.33] 02/18/2013  . IDDM (insulin dependent diabetes mellitus) [E11.9, Z79.4] 02/18/2013  . DOE (dyspnea on exertion) [R06.09] 02/18/2013    Total Time spent with patient: 30 minutes  Subjective:   Cody Schmitt is a 80 y.o. male patient admitted with delirium and confusion.  HPI: Cody Schmitt is a 80 years old male admitted to [facility] on Oct 10, 2014 with significant altered mental status from [facility]. Patient was intubated on 5/5 secondary to combative behaviors secondary to AMS. Patient was extubated on 5/8. His medical evaluation completed with Echo, EEG and MRI of brain which are normal. Patient continue to be confused but pleasant at this time. Patient laughs when he found himself that was confused and unable to recall basic information about where he lives etc. He knows his first and last name and knows he is in hospital. He states today date is May 25 and unable to respond to year. He is able to recognize three objects shown to him, able to point out with index finger way to his  room door and window. He knows he is living with his wife. He seems like has intact long term memory than immediate and delayed memory. He denied being depressed, anxious and psychosis. Patient has no behavioral problems this morning but know history of trying to get out of bed and trouble walking and using rest room etc. Patient has no physical complaints during this visit. Past Psychiatric history: Patient has no known psych history or acute psych hospitalization. Social history: Patient currently living with his wife and has history of working in [employer], [employer].   Interval history: Patient seen today for psychiatric consultation follow-up. Patient appeared lying in his bed somewhat sleepy. Patient continued to be confused and become poor historian. Patient reported he does not know his current situation, name of the hospital and how long his being here. Patient believes is still in [facility]. Patient responds "I don't know" for most of the questions. Patient stated his wife has been working on unable to visit him. Patient stated he is not hungry and not eating well. Staff is that next to his bed reported patient has been agitated and trying to get out of the bed and did not sleep overnight even after taking his medication Seroquel 25 mg as needed for agitation. He is able to count down nembers ten to 1 and able to spell the names of week from Monday to Sunday and Sunday to Monday with some delayed response. He denies depression, mania, psychosis and no safety concerns symptoms of suicidal ideation or homicidal  ideation, intention or plan.   Past Medical History:  Past Medical History  Diagnosis Date  . HTN (hypertension)   . Asthma   . Hepatitis C   . Dyslipidemia   . GERD (gastroesophageal reflux disease)   . Diabetes mellitus without complication     Past Surgical History  Procedure Laterality Date  . Colonoscopy    . Cataract surgery     Family History:  Family  History  Problem Relation Age of Onset  . Hypertension Mother   . Hypertension Father    Social History:  History  Alcohol Use: Not on file     History  Drug Use Not on file    History   Social History  . Marital Status: Married    Spouse Name: N/A  . Number of Children: N/A  . Years of Education: N/A   Social History Main Topics  . Smoking status: Former Smoker -- 0.50 packs/day for 20 years    Types: Cigarettes    Quit date: 06/10/1982  . Smokeless tobacco: Not on file  . Alcohol Use: Not on file  . Drug Use: Not on file  . Sexual Activity: Not on file   Other Topics Concern  . None   Social History Narrative   Additional Social History:                          Allergies:   Allergies  Allergen Reactions  . Lisinopril Cough  . Statins Nausea And Vomiting  . Zetia [Ezetimibe] Nausea And Vomiting  . Coumadin [Warfarin Sodium] Rash  . Dilaudid [Hydromorphone Hcl] Itching and Rash    Labs:  Results for orders placed or performed during the hospital encounter of 10/10/14 (from the past 48 hour(s))  Glucose, capillary     Status: Abnormal   Collection Time: 10/25/14 12:27 PM  Result Value Ref Range   Glucose-Capillary 152 (H) 65 - 99 mg/dL  Glucose, capillary     Status: Abnormal   Collection Time: 10/25/14  4:52 PM  Result Value Ref Range   Glucose-Capillary 140 (H) 65 - 99 mg/dL  Glucose, capillary     Status: Abnormal   Collection Time: 10/25/14  9:17 PM  Result Value Ref Range   Glucose-Capillary 126 (H) 65 - 99 mg/dL  Basic metabolic panel     Status: Abnormal   Collection Time: 10/26/14  5:02 AM  Result Value Ref Range   Sodium 139 135 - 145 mmol/L   Potassium 3.4 (L) 3.5 - 5.1 mmol/L   Chloride 104 101 - 111 mmol/L   CO2 26 22 - 32 mmol/L   Glucose, Bld 120 (H) 65 - 99 mg/dL   BUN 18 6 - 20 mg/dL   Creatinine, Ser 1.59 (H) 0.61 - 1.24 mg/dL   Calcium 8.5 (L) 8.9 - 10.3 mg/dL   GFR calc non Af Amer 40 (L) >60 mL/min   GFR calc Af  Amer 47 (L) >60 mL/min    Comment: (NOTE) The eGFR has been calculated using the CKD EPI equation. This calculation has not been validated in all clinical situations. eGFR's persistently <60 mL/min signify possible Chronic Kidney Disease.    Anion gap 9 5 - 15  CBC     Status: Abnormal   Collection Time: 10/26/14  5:02 AM  Result Value Ref Range   WBC 7.1 4.0 - 10.5 K/uL   RBC 3.54 (L) 4.22 - 5.81 MIL/uL   Hemoglobin 9.8 (L)  13.0 - 17.0 g/dL   HCT 31.1 (L) 39.0 - 52.0 %   MCV 87.9 78.0 - 100.0 fL   MCH 27.7 26.0 - 34.0 pg   MCHC 31.5 30.0 - 36.0 g/dL   RDW 20.9 (H) 11.5 - 15.5 %   Platelets 164 150 - 400 K/uL  Glucose, capillary     Status: Abnormal   Collection Time: 10/26/14  7:41 AM  Result Value Ref Range   Glucose-Capillary 126 (H) 65 - 99 mg/dL  Ammonia     Status: Abnormal   Collection Time: 10/26/14  9:35 AM  Result Value Ref Range   Ammonia 135 (H) 9 - 35 umol/L  Glucose, capillary     Status: Abnormal   Collection Time: 10/26/14 12:02 PM  Result Value Ref Range   Glucose-Capillary 133 (H) 65 - 99 mg/dL  Glucose, capillary     Status: Abnormal   Collection Time: 10/26/14  4:52 PM  Result Value Ref Range   Glucose-Capillary 122 (H) 65 - 99 mg/dL  Glucose, capillary     Status: Abnormal   Collection Time: 10/26/14  9:18 PM  Result Value Ref Range   Glucose-Capillary 118 (H) 65 - 99 mg/dL   Comment 1 Notify RN   Basic metabolic panel     Status: Abnormal   Collection Time: 10/27/14  6:00 AM  Result Value Ref Range   Sodium 141 135 - 145 mmol/L   Potassium 3.3 (L) 3.5 - 5.1 mmol/L   Chloride 104 101 - 111 mmol/L   CO2 27 22 - 32 mmol/L   Glucose, Bld 119 (H) 65 - 99 mg/dL   BUN 17 6 - 20 mg/dL   Creatinine, Ser 1.46 (H) 0.61 - 1.24 mg/dL   Calcium 8.2 (L) 8.9 - 10.3 mg/dL   GFR calc non Af Amer 45 (L) >60 mL/min   GFR calc Af Amer 52 (L) >60 mL/min    Comment: (NOTE) The eGFR has been calculated using the CKD EPI equation. This calculation has not been  validated in all clinical situations. eGFR's persistently <60 mL/min signify possible Chronic Kidney Disease.    Anion gap 10 5 - 15  Glucose, capillary     Status: Abnormal   Collection Time: 10/27/14  8:12 AM  Result Value Ref Range   Glucose-Capillary 120 (H) 65 - 99 mg/dL    Vitals: Blood pressure 125/51, pulse 80, temperature 98.6 F (37 C), temperature source Oral, resp. rate 20, height 5' 10"  (1.778 m), weight 116 kg (255 lb 11.7 oz), SpO2 98 %.  Risk to Self: Is patient at risk for suicide?: No Risk to Others:   Prior Inpatient Therapy:   Prior Outpatient Therapy:    Current Facility-Administered Medications  Medication Dose Route Frequency Provider Last Rate Last Dose  . arformoterol (BROVANA) nebulizer solution 15 mcg  15 mcg Nebulization BID Corey Harold, NP   15 mcg at 10/27/14 0943  . aspirin EC tablet 81 mg  81 mg Oral Daily Chesley Mires, MD   81 mg at 10/26/14 8144  . budesonide (PULMICORT) nebulizer solution 0.25 mg  0.25 mg Nebulization BID Corey Harold, NP   0.25 mg at 10/27/14 0946  . feeding supplement (GLUCERNA SHAKE) (GLUCERNA SHAKE) liquid 237 mL  237 mL Oral BID BM Ardeen Garland, RD   237 mL at 10/26/14 0927  . furosemide (LASIX) injection 80 mg  80 mg Intravenous BID Delfina Redwood, MD   80 mg at 10/27/14 0801  .  heparin injection 5,000 Units  5,000 Units Subcutaneous 3 times per day Corey Harold, NP   5,000 Units at 10/27/14 0558  . hydrALAZINE (APRESOLINE) injection 10-40 mg  10-40 mg Intravenous Q4H PRN Corey Harold, NP      . insulin aspart (novoLOG) injection 0-15 Units  0-15 Units Subcutaneous TID WC Wilhelmina Mcardle, MD   2 Units at 10/26/14 1747  . insulin aspart (novoLOG) injection 0-5 Units  0-5 Units Subcutaneous QHS Wilhelmina Mcardle, MD   2 Units at 10/20/14 2200  . lactulose (CHRONULAC) 10 GM/15ML solution 30 g  30 g Oral TID Ripudeep Krystal Eaton, MD   30 g at 10/26/14 2112  . metoprolol succinate (TOPROL-XL) 24 hr tablet 25 mg  25 mg Oral  Daily Delfina Redwood, MD   25 mg at 10/26/14 2409  . pantoprazole (PROTONIX) EC tablet 40 mg  40 mg Oral Q1200 Wilhelmina Mcardle, MD   40 mg at 10/26/14 7353  . QUEtiapine (SEROQUEL) tablet 25 mg  25 mg Oral TID PRN Ambrose Finland, MD   25 mg at 10/26/14 2112  . rifaximin (XIFAXAN) tablet 550 mg  550 mg Oral BID Delfina Redwood, MD   550 mg at 10/26/14 2112  . spironolactone (ALDACTONE) tablet 50 mg  50 mg Oral BID Delfina Redwood, MD   50 mg at 10/27/14 0802    Musculoskeletal: Strength & Muscle Tone: decreased Gait & Station: unable to stand Patient leans: N/A  Psychiatric Specialty Exam: Physical Exam  ROS not done due to his current mental status  Blood pressure 125/51, pulse 80, temperature 98.6 F (37 C), temperature source Oral, resp. rate 20, height 5' 10"  (1.778 m), weight 116 kg (255 lb 11.7 oz), SpO2 98 %.Body mass index is 36.69 kg/(m^2).  General Appearance: Casual, pleasantly confused  Eye Contact::  Good  Speech:  Blocked and Slow  Volume:  Decreased  Mood:  Anxious  Affect:  Appropriate and Congruent  Thought Process:  Loose  Orientation:  Full (Time, Place, and Person) but not for his situation  Thought Content:  Rumination  Suicidal Thoughts:  No  Homicidal Thoughts:  No  Memory:  Immediate;   Fair Recent;   Poor  Judgement:  Poor  Insight:  Lacking  Psychomotor Activity:  Decreased  Concentration:  Poor  Recall:  Poor  Fund of Knowledge:Fair  Language: Good  Akathisia:  Negative  Handed:  Right  AIMS (if indicated):     Assets:  Agricultural consultant Housing Intimacy Leisure Time Resilience Social Support  ADL's:  Impaired  Cognition: Impaired,  Moderate  Sleep:      Medical Decision Making: New problem, with additional work up planned, Review of Psycho-Social Stressors (1), Review or order clinical lab tests (1), Discuss test with performing physician (1), Review of Last Therapy Session (1), Review or  order medicine tests (1), Review of Medication Regimen & Side Effects (2) and Review of New Medication or Change in Dosage (2)  Treatment Plan Summary: Patient continue with significant cognitive deficits, confusion, memory deficits both immediate and delayed but intact long-term memory. Patient has intermittently agitated and confused. Patient was not able to care for himself and needed out-of-home placement with rehabilitation services. Daily contact with patient to assess and evaluate symptoms and progress in treatment and Medication management  Plan: Patient does not meet criteria for capacity to make his own medical decisions or living arrangements  Agitation or combative behaviors: Seroquel 25 mg  TID/PRN, which helped earlier   Patient does not meet criteria for psychiatric inpatient admission. Supportive therapy provided about ongoing stressors.  May contact psych social service if needed information about obtaining [facility]  Disposition: Patient benefit from out of home placement to SNF with rehab available due to lack of capacity to care for himself and ongoing confusion.   Amyria Komar,JANARDHAHA R. 10/27/2014 9:49 AM

## 2014-10-28 LAB — BASIC METABOLIC PANEL
ANION GAP: 10 (ref 5–15)
BUN: 16 mg/dL (ref 6–20)
CALCIUM: 8.7 mg/dL — AB (ref 8.9–10.3)
CO2: 26 mmol/L (ref 22–32)
Chloride: 103 mmol/L (ref 101–111)
Creatinine, Ser: 1.44 mg/dL — ABNORMAL HIGH (ref 0.61–1.24)
GFR calc Af Amer: 53 mL/min — ABNORMAL LOW (ref >60)
GFR calc non Af Amer: 45 mL/min — ABNORMAL LOW (ref >60)
Glucose, Bld: 111 mg/dL — ABNORMAL HIGH (ref 65–99)
Potassium: 3.7 mmol/L (ref 3.5–5.1)
SODIUM: 139 mmol/L (ref 135–145)

## 2014-10-28 LAB — AMMONIA: Ammonia: 46 umol/L — ABNORMAL HIGH (ref 9–35)

## 2014-10-28 LAB — GLUCOSE, CAPILLARY
GLUCOSE-CAPILLARY: 167 mg/dL — AB (ref 65–99)
Glucose-Capillary: 113 mg/dL — ABNORMAL HIGH (ref 65–99)
Glucose-Capillary: 149 mg/dL — ABNORMAL HIGH (ref 65–99)

## 2014-10-28 MED ORDER — INSULIN ASPART 100 UNIT/ML ~~LOC~~ SOLN: 0.0000 [IU] | Freq: Three times a day (TID) | SUBCUTANEOUS | Status: DC

## 2014-10-28 MED ORDER — QUETIAPINE FUMARATE 25 MG PO TABS
25.0000 mg | ORAL_TABLET | Freq: Three times a day (TID) | ORAL | Status: DC | PRN
Start: 2014-10-28 — End: 2015-07-12

## 2014-10-28 MED ORDER — RIFAXIMIN 550 MG PO TABS: 550.0000 mg | ORAL_TABLET | Freq: Two times a day (BID) | ORAL | Status: AC

## 2014-10-28 MED ORDER — LACTULOSE 10 GM/15ML PO SOLN: 30.0000 g | Freq: Three times a day (TID) | ORAL | Status: AC

## 2014-10-28 MED ORDER — GLUCERNA SHAKE PO LIQD: 237.0000 mL | Freq: Two times a day (BID) | ORAL | Status: DC

## 2014-10-28 MED ORDER — SPIRONOLACTONE 50 MG PO TABS: 50.0000 mg | ORAL_TABLET | Freq: Two times a day (BID) | ORAL | Status: DC

## 2014-10-28 NOTE — Consult Note (Signed)
Psychiatry Consult follow-up  Reason for Consult:  Delirium with behavioral problems Referring Physician:  Dr. Tana Coast Patient Identification: Cody Schmitt MRN:  [id number] Principal Diagnosis: Acute delirium Diagnosis:   Patient Active Problem List   Diagnosis Date Noted  . Atelectasis [J98.11]   . Acute delirium [R41.0] 10/26/2014  . Acute pulmonary edema [J81.0]   . Essential hypertension [I10]   . Demand ischemia [I24.8]   . Chronic renal failure, stage 3 (moderate) [N18.3]   . Hx of fluid overload [Z86.39]   . Hepatitis C antibody test positive [R89.4]   . Diabetes type 2, controlled [E11.9]   . Hypernatremia [E87.0]   . Physical deconditioning [R53.81]   . Respiratory failure [J96.90]   . Severe sepsis [A41.9, R65.20]   . Aspiration pneumonia [J69.0]   . Acute encephalopathy [G93.40] 10/10/2014  . COPD (chronic obstructive pulmonary disease) [J44.9] 02/18/2013  . OSA (obstructive sleep apnea) [G47.33] 02/18/2013  . IDDM (insulin dependent diabetes mellitus) [E11.9, Z79.4] 02/18/2013  . DOE (dyspnea on exertion) [R06.09] 02/18/2013    Total Time spent with patient: 30 minutes  Subjective:   Cody Schmitt is a 80 y.o. male patient admitted with delirium and confusion.  HPI: Cody Schmitt is a 80 years old male admitted to [facility] on Oct 10, 2014 with significant altered mental status from [facility]. Patient was intubated on 5/5 secondary to combative behaviors secondary to AMS. Patient was extubated on 5/8. His medical evaluation completed with Echo, EEG and MRI of brain which are normal. Patient continue to be confused but pleasant at this time. Patient laughs when he found himself that was confused and unable to recall basic information about where he lives etc. He knows his first and last name and knows he is in hospital. He states today date is May 25 and unable to respond to year. He is able to recognize three objects shown to him, able to point out with  index finger way to his room door and window. He knows he is living with his wife. He seems like has intact long term memory than immediate and delayed memory. He denied being depressed, anxious and psychosis. Patient has no behavioral problems this morning but know history of trying to get out of bed and trouble walking and using rest room etc. Patient has no physical complaints during this visit. Past Psychiatric history: Patient has no known psych history or acute psych hospitalization. Social history: Patient currently living with his wife and has history of working in [employer], [employer].   Interval history: Patient seen today for psychiatric consultation follow-up. Patient appeared lying in his bed , awake, alert but not oriented to time place person and situation. Patient has no new complaints and difficult to remember what is happening around him second is significant difficulties with his cognitions. Patient appeared pleasantly confused but compliant with the staff members and medication management. Patient has no agitation or aggressive behavior.     Past Medical History:  Past Medical History  Diagnosis Date  . HTN (hypertension)   . Asthma   . Hepatitis C   . Dyslipidemia   . GERD (gastroesophageal reflux disease)   . Diabetes mellitus without complication     Past Surgical History  Procedure Laterality Date  . Colonoscopy    . Cataract surgery     Family History:  Family History  Problem Relation Age of Onset  . Hypertension Mother   . Hypertension Father    Social  History:  History  Alcohol Use: Not on file     History  Drug Use Not on file    History   Social History  . Marital Status: Married    Spouse Name: N/A  . Number of Children: N/A  . Years of Education: N/A   Social History Main Topics  . Smoking status: Former Smoker -- 0.50 packs/day for 20 years    Types: Cigarettes    Quit date: 06/10/1982  . Smokeless tobacco: Not on file  .  Alcohol Use: Not on file  . Drug Use: Not on file  . Sexual Activity: Not on file   Other Topics Concern  . None   Social History Narrative   Additional Social History:                          Allergies:   Allergies  Allergen Reactions  . Lisinopril Cough  . Statins Nausea And Vomiting  . Zetia [Ezetimibe] Nausea And Vomiting  . Coumadin [Warfarin Sodium] Rash  . Dilaudid [Hydromorphone Hcl] Itching and Rash    Labs:  Results for orders placed or performed during the hospital encounter of 10/10/14 (from the past 48 hour(s))  Glucose, capillary     Status: Abnormal   Collection Time: 10/26/14  4:52 PM  Result Value Ref Range   Glucose-Capillary 122 (H) 65 - 99 mg/dL  Glucose, capillary     Status: Abnormal   Collection Time: 10/26/14  9:18 PM  Result Value Ref Range   Glucose-Capillary 118 (H) 65 - 99 mg/dL   Comment 1 Notify RN   Basic metabolic panel     Status: Abnormal   Collection Time: 10/27/14  6:00 AM  Result Value Ref Range   Sodium 141 135 - 145 mmol/L   Potassium 3.3 (L) 3.5 - 5.1 mmol/L   Chloride 104 101 - 111 mmol/L   CO2 27 22 - 32 mmol/L   Glucose, Bld 119 (H) 65 - 99 mg/dL   BUN 17 6 - 20 mg/dL   Creatinine, Ser 1.46 (H) 0.61 - 1.24 mg/dL   Calcium 8.2 (L) 8.9 - 10.3 mg/dL   GFR calc non Af Amer 45 (L) >60 mL/min   GFR calc Af Amer 52 (L) >60 mL/min    Comment: (NOTE) The eGFR has been calculated using the CKD EPI equation. This calculation has not been validated in all clinical situations. eGFR's persistently <60 mL/min signify possible Chronic Kidney Disease.    Anion gap 10 5 - 15  Glucose, capillary     Status: Abnormal   Collection Time: 10/27/14  8:12 AM  Result Value Ref Range   Glucose-Capillary 120 (H) 65 - 99 mg/dL  Glucose, capillary     Status: Abnormal   Collection Time: 10/27/14 12:08 PM  Result Value Ref Range   Glucose-Capillary 164 (H) 65 - 99 mg/dL  Glucose, capillary     Status: Abnormal   Collection Time:  10/27/14  5:34 PM  Result Value Ref Range   Glucose-Capillary 149 (H) 65 - 99 mg/dL  Glucose, capillary     Status: Abnormal   Collection Time: 10/27/14 10:02 PM  Result Value Ref Range   Glucose-Capillary 126 (H) 65 - 99 mg/dL  Basic metabolic panel     Status: Abnormal   Collection Time: 10/28/14  4:15 AM  Result Value Ref Range   Sodium 139 135 - 145 mmol/L   Potassium 3.7 3.5 - 5.1 mmol/L  Chloride 103 101 - 111 mmol/L   CO2 26 22 - 32 mmol/L   Glucose, Bld 111 (H) 65 - 99 mg/dL   BUN 16 6 - 20 mg/dL   Creatinine, Ser 1.44 (H) 0.61 - 1.24 mg/dL   Calcium 8.7 (L) 8.9 - 10.3 mg/dL   GFR calc non Af Amer 45 (L) >60 mL/min   GFR calc Af Amer 53 (L) >60 mL/min    Comment: (NOTE) The eGFR has been calculated using the CKD EPI equation. This calculation has not been validated in all clinical situations. eGFR's persistently <60 mL/min signify possible Chronic Kidney Disease.    Anion gap 10 5 - 15  Ammonia     Status: Abnormal   Collection Time: 10/28/14  4:15 AM  Result Value Ref Range   Ammonia 46 (H) 9 - 35 umol/L  Glucose, capillary     Status: Abnormal   Collection Time: 10/28/14  8:12 AM  Result Value Ref Range   Glucose-Capillary 113 (H) 65 - 99 mg/dL  Glucose, capillary     Status: Abnormal   Collection Time: 10/28/14 11:58 AM  Result Value Ref Range   Glucose-Capillary 149 (H) 65 - 99 mg/dL    Vitals: Blood pressure 147/55, pulse 85, temperature 99.2 F (37.3 C), temperature source Oral, resp. rate 16, height _0  (1.778 m), weight 110.5 kg (243 lb 9.7 oz), SpO2 98 %.  Risk to Self: Is patient at risk for suicide?: No Risk to Others:   Prior Inpatient Therapy:   Prior Outpatient Therapy:    Current Facility-Administered Medications  Medication Dose Route Frequency Provider Last Rate Last Dose  . arformoterol (BROVANA) nebulizer solution 15 mcg  15 mcg Nebulization BID Corey Harold, NP   15 mcg at 10/28/14 [phone]  . aspirin EC tablet 81 mg  81 mg Oral Daily  Chesley Mires, MD   81 mg at 10/28/14 1021  . budesonide (PULMICORT) nebulizer solution 0.25 mg  0.25 mg Nebulization BID Corey Harold, NP   0.25 mg at 10/28/14 3976  . feeding supplement (GLUCERNA SHAKE) (GLUCERNA SHAKE) liquid 237 mL  237 mL Oral BID BM Ardeen Garland, RD   237 mL at 10/28/14 1021  . furosemide (LASIX) injection 80 mg  80 mg Intravenous BID Delfina Redwood, MD   80 mg at 10/28/14 7341  . heparin injection 5,000 Units  5,000 Units Subcutaneous 3 times per day Corey Harold, NP   5,000 Units at 10/28/14 9379  . hydrALAZINE (APRESOLINE) injection 10-40 mg  10-40 mg Intravenous Q4H PRN Corey Harold, NP      . insulin aspart (novoLOG) injection 0-15 Units  0-15 Units Subcutaneous TID WC Wilhelmina Mcardle, MD   2 Units at 10/27/14 1755  . insulin aspart (novoLOG) injection 0-5 Units  0-5 Units Subcutaneous QHS Wilhelmina Mcardle, MD   2 Units at 10/20/14 2200  . lactulose (CHRONULAC) 10 GM/15ML solution 30 g  30 g Oral TID Ripudeep K Rai, MD   30 g at 10/28/14 1021  . metoprolol succinate (TOPROL-XL) 24 hr tablet 25 mg  25 mg Oral Daily Delfina Redwood, MD   25 mg at 10/28/14 1021  . pantoprazole (PROTONIX) EC tablet 40 mg  40 mg Oral Q1200 Wilhelmina Mcardle, MD   40 mg at 10/27/14 1228  . QUEtiapine (SEROQUEL) tablet 25 mg  25 mg Oral TID PRN Ambrose Finland, MD   25 mg at 10/26/14 2112  . rifaximin Doreene Nest)  tablet 550 mg  550 mg Oral BID Delfina Redwood, MD   550 mg at 10/28/14 1021  . spironolactone (ALDACTONE) tablet 50 mg  50 mg Oral BID Delfina Redwood, MD   50 mg at 10/28/14 2550    Musculoskeletal: Strength & Muscle Tone: decreased Gait & Station: unable to stand Patient leans: N/A  Psychiatric Specialty Exam: Physical Exam  ROS not done due to his current mental status  Blood pressure 147/55, pulse 85, temperature 99.2 F (37.3 C), temperature source Oral, resp. rate 16, height _0  (1.778 m), weight 110.5 kg (243 lb 9.7 oz), SpO2 98 %.Body mass  index is 34.95 kg/(m^2).  General Appearance: Casual, pleasantly confused  Eye Contact::  Good  Speech:  Blocked and Slow  Volume:  Decreased  Mood:  Anxious  Affect:  Appropriate and Congruent  Thought Process:  Loose  Orientation:  Full (Time, Place, and Person) but not for his situation  Thought Content:  Rumination  Suicidal Thoughts:  No  Homicidal Thoughts:  No  Memory:  Immediate;   Fair Recent;   Poor  Judgement:  Poor  Insight:  Lacking  Psychomotor Activity:  Decreased  Concentration:  Poor  Recall:  Poor  Fund of Knowledge:Fair  Language: Good  Akathisia:  Negative  Handed:  Right  AIMS (if indicated):     Assets:  Agricultural consultant Housing Intimacy Leisure Time Resilience Social Support  ADL's:  Impaired  Cognition: Impaired,  Moderate  Sleep:      Medical Decision Making: New problem, with additional work up planned, Review of Psycho-Social Stressors (1), Review or order clinical lab tests (1), Discuss test with performing physician (1), Review of Last Therapy Session (1), Review or order medicine tests (1), Review of Medication Regimen & Side Effects (2) and Review of New Medication or Change in Dosage (2)  Treatment Plan Summary: Patient continue with significant cognitive deficits, confusion, memory deficits both immediate and delayed but intact long-term memory. Patient has intermittently agitated and confused. Patient was not able to care for himself and needed out-of-home placement with rehabilitation services. Daily contact with patient to assess and evaluate symptoms and progress in treatment and Medication management  Plan: Patient does not meet criteria for capacity to make his own medical decisions or living arrangements  Agitation or combative behaviors: Seroquel 25 mg TID/PRN, which helped earlier   Patient does not meet criteria for psychiatric inpatient admission. Supportive therapy provided about ongoing  stressors.  May contact psych social service if needed information about obtaining MCPOA  Disposition: Patient benefit from out of home placement to SNF with rehab available due to lack of capacity to care for himself and ongoing confusion.   Jaimere Feutz,JANARDHAHA R. 10/28/2014 12:17 PM

## 2014-10-28 NOTE — Discharge Summary (Signed)
Physician Discharge Summary   Patient ID: Cody RelicJohn W Uhlir MRN: [id number] DOB/AGE: 78/06/1934 78 y.o.  Admit date: 10/10/2014 Discharge date: 10/28/2014  Primary Care Physician:  Ignatius SpeckingVYAS,DHRUV B., MD  Discharge Diagnoses:    . Acute encephalopathy- improving, possibly due to hepatic encephalopathy  . Aspiration pneumonia . Severe sepsis . Respiratory failure . Hypernatremia . Physical deconditioning . Acute pulmonary edema . Essential hypertension . Demand ischemia . Chronic renal failure, stage 3 (moderate) . Hx of fluid overload . Hepatitis C antibody test positive . Diabetes type 2, controlled . COPD (chronic obstructive pulmonary disease) . OSA (obstructive sleep apnea) . Acute delirium  Consults:  Pulmonology  Psychiatry Neurology  Recommendations for Outpatient Follow-up:  Fall precautions  Please titrate lactulose for 3-4 bowel movements in a day   TESTS THAT NEED FOLLOW-UP CBC, CMET   DIET:  carb modified/heart healthy diet     Allergies:   Allergies  Allergen Reactions  . Lisinopril Cough  . Statins Nausea And Vomiting  . Zetia [Ezetimibe] Nausea And Vomiting  . Coumadin [Warfarin Sodium] Rash  . Dilaudid [Hydromorphone Hcl] Itching and Rash     Discharge Medications:   Medication List    STOP taking these medications        acetaminophen 650 MG CR tablet  Commonly known as:  TYLENOL     Fish Oil 1200 MG Caps     hydrochlorothiazide 25 MG tablet  Commonly known as:  HYDRODIURIL     insulin glargine 100 UNIT/ML injection  Commonly known as:  LANTUS     losartan 100 MG tablet  Commonly known as:  COZAAR     potassium chloride SA 20 MEQ tablet  Commonly known as:  K-DUR,KLOR-CON     Red Yeast Rice 600 MG Caps     saxagliptin HCl 5 MG Tabs tablet  Commonly known as:  ONGLYZA      TAKE these medications        albuterol 108 (90 BASE) MCG/ACT inhaler  Commonly known as:  PROVENTIL HFA;VENTOLIN HFA  Inhale 2 puffs into the lungs  2 (two) times daily.     arformoterol 15 MCG/2ML Nebu  Commonly known as:  BROVANA  Take 15 mcg by nebulization 2 (two) times daily.     aspirin EC 81 MG tablet  Take 81 mg by mouth daily.     clotrimazole-betamethasone cream  Commonly known as:  LOTRISONE  Apply 1 application topically 2 (two) times daily as needed (rash).     feeding supplement (GLUCERNA SHAKE) Liqd  Take 237 mLs by mouth 2 (two) times daily between meals.     furosemide 40 MG tablet  Commonly known as:  LASIX  Take 40-80 mg by mouth 2 (two) times daily. Take 2 tablets (80 mg) daily at 6am and 1 tablet (40 mg) daily at 4pm     insulin aspart 100 UNIT/ML injection  Commonly known as:  novoLOG  Inject 0-15 Units into the skin 3 (three) times daily with meals. Sliding scale  CBG 70 - 120: 0 units: CBG 121 - 150: 2 units; CBG 151 - 200: 3 units; CBG 201 - 250: 5 units; CBG 251 - 300: 8 units;CBG 301 - 350: 11 units; CBG 351 - 400: 15 units; CBG > 400 : 15 units and notify MD     lactulose 10 GM/15ML solution  Commonly known as:  CHRONULAC  Take 45 mLs (30 g total) by mouth 3 (three) times daily.     metoprolol  succinate 25 MG 24 hr tablet  Commonly known as:  TOPROL-XL  Take 25 mg by mouth daily.     mometasone 220 MCG/INH inhaler  Commonly known as:  ASMANEX  Inhale 2 puffs into the lungs daily.     omeprazole 20 MG capsule  Commonly known as:  PRILOSEC  Take 20 mg by mouth daily.     QUEtiapine 25 MG tablet  Commonly known as:  SEROQUEL  Take 1 tablet (25 mg total) by mouth 3 (three) times daily as needed (agitation and combative behaviors).     REFRESH OP  Place 1 drop into both eyes daily as needed (dry eyes).     rifaximin 550 MG Tabs tablet  Commonly known as:  XIFAXAN  Take 1 tablet (550 mg total) by mouth 2 (two) times daily.     spironolactone 50 MG tablet  Commonly known as:  ALDACTONE  Take 1 tablet (50 mg total) by mouth 2 (two) times daily.     Vitamin D 2000 UNITS tablet  Take 2,000  Units by mouth daily.         Brief H and P: For complete details please refer to admission H and P, but in brief The patient is a 80 year old male with hypertension, asthma, hepatitis C, dyslipidemia, GERD, diabetes, had trouble walking for the past 3 months, was noted to have altered mental status 5/1. He was taken to [facility] ED and was agitated requiring several doses of Ativan. In ED, patient was noted to be febrile with elevated leukocytosis but no obvious source of infection. LP was attempted in ED and was unsuccessful. Patient was transferred to [facility] for ICU admission and further workup.  Hospital Course:   Acute encephalopathy, likely due to hepatic encephalopathy, possibly hepatic steatosis , waxing and waning however has significant improvement from the time of admission. CT head was negative for acute intracranial issues. Patient remained agitated at the time of admission and was placed on Precedex drip and in restraints. Neurology was consulted. EEG showed generalized slowing but no seizure focus. MRI of the brain was normal. 2-D echocardiogram showed mild-to-moderate LVH, EF 55-60%, right upper quadrant ultrasound showed cholelithiasis with diffuse coarsening of the hepatic parenchymal structure. Patient's agitation worsened off the Precedex and had coarse secretions and impending acute respiratory failure, patient was intubated on 5/5. LP was performed on 5/6, LPCSF studies showed glucose 83, protein 133, RBC 251, WBC 1 Patient was extubated on 5/8 however remained on Precedex drip. Patient was finally weaned off Precedex on 5/11 and placed on IV diuresis for severe volume overload. Dopplers of the left leg showed no DVT. Patient was transferred to stepdown on 5/11 and transferred to hospitalist service on 5/12. Patient had extensive workup during this admission including spinal tap, MRI of the brain, EEG, all negative.  He was briefly placed on Seroquel which was discontinued  due to no improved affect, patient however remains agitated. Ammonia level was elevated 47 on 5/15 and 135 on 5/18. Patient was placed on lactulose and titrated to goal 3-4 bowel movements in a day. Patient's mental status has been improving and he has been without a sitter for more than 24 hours. Ammonia level has improved to 46. He was also placed on rifaximin. Patient had a right upper quadrant ultrasound on 5/3 which showed cholelithiasis with diffusely coarsened hepatic parenchymal echotexture, ?cirrhosis. Patient will benefit from  outpatient GI workup.  Acute respiratory failure / Severe sepsis / aspiration pneumonia? -Extubated, currently O2  sats 99% on room air, resolved  Volume overload/lower extremity edema: Improving  Continue Lasix, patient was also placed on spironolactone. He had significant watery overload with lower extremity edema which has improved, currently has a negative balance of 7.3 L.patient can continue Lasix at home dose.   Obstructive sleep apnea -CPAP QHS   HTN -Currently stable, continue Lasix, metoprolol  NSTEMI vs demand ischemia -hemodynamically stable - troponin appears to have peaked at 0.17 -Continue ASA 81 mg daily  Mild acute on chronic CKD stage 3 w/ fluid overload  -Creatinine improving, 1.4, follow BMET at the time of appointment    Anemia without acute blood loss -Hemoglobin stable, 9.8, transfuse if hemoglobin less than 8  Thrombocytopenia suspect chronic and related to liver disease - improving  DM 2 -CBG is currently fairly controlled, continue sliding scale insulin    Day of Discharge BP 147/55 mmHg  Pulse 85  Temp(Src) 99.2 F (37.3 C) (Oral)  Resp 16  Ht  (1.778 m)  Wt 110.5 kg (243 lb 9.7 oz)  BMI 34.95 kg/m2  SpO2 98%  Physical Exam: General: Alert and awake oriented x 2 not in any acute distress. HEENT: anicteric sclera, pupils reactive to light and accommodation CVS: S1-S2 clear no murmur rubs or gallops Chest:  clear to auscultation bilaterally, no wheezing rales or rhonchi Abdomen: soft nontender, nondistended, normal bowel sounds Extremities: no cyanosis, clubbing, 1+ edema noted bilaterally Neuro: Cranial nerves II-XII intact, no focal neurological deficits   The results of significant diagnostics from this hospitalization (including imaging, microbiology, ancillary and laboratory) are listed below for reference.    LAB RESULTS: Basic Metabolic Panel:  Recent Labs Lab 10/22/14 0228  10/25/14 0418  10/27/14 0600 10/28/14 0415  NA 142  < > 139  < > 141 139  K 3.5  < > 3.2*  < > 3.3* 3.7  CL 110  < > 103  < > 104 103  CO2 23  < > 23  < > 27 26  GLUCOSE 123*  < > 134*  < > 119* 111*  BUN 31*  < > 20  < > 17 16  CREATININE 1.82*  < > 1.54*  < > 1.46* 1.44*  CALCIUM 7.9*  < > 8.3*  < > 8.2* 8.7*  MG 1.7  --   --   --   --   --   PHOS  --   --  3.7  --   --   --   < > = values in this interval not displayed. Liver Function Tests:  Recent Labs Lab 10/23/14 0335 10/24/14 0235 10/25/14 0418  AST 82* 93*  --   ALT 55 62  --   ALKPHOS 123 137*  --   BILITOT 1.1 1.1  --   PROT 5.7* 6.9  --   ALBUMIN 1.9* 1.9* 2.0*   No results for input(s): LIPASE, AMYLASE in the last 168 hours.  Recent Labs Lab 10/26/14 0935 10/28/14 0415  AMMONIA 135* 46*   CBC:  Recent Labs Lab 10/24/14 0235 10/26/14 0502  WBC 8.2 7.1  HGB 9.8* 9.8*  HCT 31.1* 31.1*  MCV 87.4 87.9  PLT 122* 164   Cardiac Enzymes: No results for input(s): CKTOTAL, CKMB, CKMBINDEX, TROPONINI in the last 168 hours. BNP: Invalid input(s): POCBNP CBG:  Recent Labs Lab 10/27/14 2202 10/28/14 0812  GLUCAP 126* 113*    Significant Diagnostic Studies:  Dg Chest Port 1 View  10/10/2014   CLINICAL DATA:  Central line placement  EXAM: PORTABLE CHEST - 1 VIEW  COMPARISON:  10/09/2014  FINDINGS: There is a new right subclavian central line with tip in the SVC several cm below the azygos vein junction. There is no  pneumothorax. The lungs are clear except for mild interstitial fluid or thickening.  IMPRESSION: Satisfactorily positioned right subclavian central line. No pneumothorax.   Electronically Signed   By: Ellery Plunkaniel R Mitchell M.D.   On: 10/10/2014 23:34   [place] Abdomen Limited Ruq  10/11/2014   CLINICAL DATA:  ` right upper quadrant pain  EXAM: US ABDOMEN LIMITED - RIGHT UPPER QUADRANT  COMPARISON:  None.  FINDINGS: Gallbladder:  Multiple calculi are present in the gallbladder lumen. There is no gallbladder wall thickening. The patient was not tender over the gallbladder.  Common bile duct:  Diameter: 3.3 mm, normal  Liver:  There is diffusely coarsened echotexture of the liver without focal lesion. There is no intrahepatic bile duct dilatation.  IMPRESSION: Cholelithiasis. Diffusely coarsened hepatic parenchymal echotexture, nonspecific.   Electronically Signed   By: Ellery Plunkaniel R Mitchell M.D.   On: 10/11/2014 01:23    2D ECHO: Study Conclusions  - Left ventricle: The cavity size was normal. There was moderate concentric hypertrophy. Systolic function was normal. The estimated ejection fraction was in the range of 55% to 60%. Wall motion was normal; there were no regional wall motion abnormalities. - Aortic valve: Thickening and calcification, consistent with sclerosis. - Mitral valve: Calcified annulus. - Left atrium: Poorly visualized. - Right ventricle: Poorly visualized. - Right atrium: Poorly visualized. - Tricuspid valve: There was trivial regurgitation. - Pulmonary arteries: PA peak pressure: 39 mm Hg (S).   Disposition and Follow-up: Discharge Instructions    Diet Carb Modified    Complete by:  As directed      Discharge instructions    Complete by:  As directed   Fall precautions  It is VERY IMPORTANT that you follow up with a PCP on a regular basis.  Check your blood glucoses before each meal and at bedtime and maintain a log of your readings.  Bring this log with you when you  follow up with your PCP so that he or she can adjust your insulin at your follow up visit.     Increase activity slowly    Complete by:  As directed             DISPOSITION: SNF   DISCHARGE FOLLOW-UP     Follow-up Information    Follow up with VYAS,DHRUV B., MD On 11/04/2014.   Specialty:  Internal Medicine   Why:  for hospital follow-up. APPt scheduled for Friday, May 27th, 2016 at 2:30pm. PLEASE BRING ALL CURRENT (LIST) MEDICATIONS THAT YOU ARE TAKING.   Contact information:   [address] [place] [phone]        Time spent on Discharge: 38 mins   Signed:   Alisan Dokes M.D. [facility] 10/28/2014, 11:26 AM Pager: [phone]

## 2014-10-28 NOTE — Care Management Note (Signed)
Case Management Note  Patient Details  Name: Cody RelicJohn W Keo MRN: [id number] Date of Birth: 12/08/1934  Subjective/Objective:                    Action/Plan:   Expected Discharge Date:                  Expected Discharge Plan:  Skilled Nursing Facility  In-House Referral:  Clinical Social Work  Discharge planning Services  CM Consult, Follow-up appt scheduled  Post Acute Care Choice:    Choice offered to:     DME Arranged:    DME Agency:     HH Arranged:    HH Agency:     Status of Service:  Completed, signed off  Medicare Important Message Given:  Yes Date Medicare IM Given:  10/26/14 Medicare IM give by:  debbie swist rn bsn cm Date Additional Medicare IM Given:    Additional Medicare Important Message give by:     If discussed at Long Length of Stay Meetings, dates discussed:    Additional Comments: Scheduled dc to SNF. CSW follow up for SNF placement.  [name], Pearl Berlinger Ellen, RN 10/28/2014, 4:11 PM

## 2014-10-28 NOTE — Clinical Social Work Placement (Signed)
   CLINICAL SOCIAL WORK PLACEMENT  NOTE  Date:  10/28/2014  Patient Details  Name: Cody RelicJohn W Monts MRN: [id number] Date of Birth: 03/06/1935  Clinical Social Work is seeking post-discharge placement for this patient at the Skilled  Nursing Facility level of care (*CSW will initial, date and re-position this form in  chart as items are completed):  Yes   Patient/family provided with [facility] Clinical Social Work Department's list of facilities offering this level of care within the geographic area requested by the patient (or if unable, by the patient's family).  Yes   Patient/family informed of their freedom to choose among providers that offer the needed level of care, that participate in Medicare, Medicaid or managed care program needed by the patient, have an available bed and are willing to accept the patient.  Yes   Patient/family informed of [facility]'s ownership interest in [facility] and [facility], as well as of the fact that they are under no obligation to receive care at these facilities.  PASRR submitted to EDS on 10/21/14     PASRR number received on 10/21/14     Existing PASRR number confirmed on       FL2 transmitted to all facilities in geographic area requested by pt/family on 10/28/14     FL2 transmitted to all facilities within larger geographic area on       Patient informed that his/her managed care company has contracts with or will negotiate with certain facilities, including the following:        Yes   Patient/family informed of bed offers received.  [facility]     Physician recommends and patient chooses bed at      Patient to [facility] on 10/28/14.  Patient to be transferred to facility by Ambulance     Patient family notified on 10/28/14 of transfer.  Name of family member notified:  [facility]     PHYSICIAN       Additional Comment:   Per MD patient ready [facility]. RN, patient, patient's family, and facility notified of DC. RN given number for report. DC packet on chart. Ambulance transport [facility] at [facility]. CSW signing off.   _______________________________________________ Venita Lickampbell, Alaia Lordi B, LCSW 10/28/2014, 3:59 PM

## 2015-06-14 ENCOUNTER — Encounter (INDEPENDENT_AMBULATORY_CARE_PROVIDER_SITE_OTHER): Payer: Self-pay | Admitting: *Deleted

## 2015-07-12 ENCOUNTER — Ambulatory Visit (INDEPENDENT_AMBULATORY_CARE_PROVIDER_SITE_OTHER): Payer: Self-pay | Admitting: Internal Medicine

## 2015-07-12 ENCOUNTER — Encounter (INDEPENDENT_AMBULATORY_CARE_PROVIDER_SITE_OTHER): Payer: Self-pay | Admitting: *Deleted

## 2015-07-12 ENCOUNTER — Encounter (INDEPENDENT_AMBULATORY_CARE_PROVIDER_SITE_OTHER): Payer: Self-pay | Admitting: Internal Medicine

## 2015-07-12 ENCOUNTER — Ambulatory Visit (INDEPENDENT_AMBULATORY_CARE_PROVIDER_SITE_OTHER): Payer: Medicare Other | Admitting: Internal Medicine

## 2015-07-12 VITALS — BP 138/68 | HR 68 | Temp 98.5°F | Ht 69.0 in | Wt 285.2 lb

## 2015-07-12 DIAGNOSIS — K7460 Unspecified cirrhosis of liver: Secondary | ICD-10-CM | POA: Diagnosis not present

## 2015-07-12 LAB — CBC WITH DIFFERENTIAL/PLATELET
BASOS ABS: 0 10*3/uL (ref 0.0–0.1)
Basophils Relative: 0 % (ref 0–1)
EOS ABS: 0.2 10*3/uL (ref 0.0–0.7)
EOS PCT: 2 % (ref 0–5)
HCT: 32.3 % — ABNORMAL LOW (ref 39.0–52.0)
Hemoglobin: 10.5 g/dL — ABNORMAL LOW (ref 13.0–17.0)
LYMPHS ABS: 1.7 10*3/uL (ref 0.7–4.0)
Lymphocytes Relative: 18 % (ref 12–46)
MCH: 31.6 pg (ref 26.0–34.0)
MCHC: 32.5 g/dL (ref 30.0–36.0)
MCV: 97.3 fL (ref 78.0–100.0)
MPV: 12.1 fL (ref 8.6–12.4)
Monocytes Absolute: 1.2 10*3/uL — ABNORMAL HIGH (ref 0.1–1.0)
Monocytes Relative: 13 % — ABNORMAL HIGH (ref 3–12)
Neutro Abs: 6.4 10*3/uL (ref 1.7–7.7)
Neutrophils Relative %: 67 % (ref 43–77)
PLATELETS: 108 10*3/uL — AB (ref 150–400)
RBC: 3.32 MIL/uL — ABNORMAL LOW (ref 4.22–5.81)
RDW: 14 % (ref 11.5–15.5)
WBC: 9.5 10*3/uL (ref 4.0–10.5)

## 2015-07-12 LAB — HEPATIC FUNCTION PANEL
ALBUMIN: 2.5 g/dL — AB (ref 3.6–5.1)
ALT: 26 U/L (ref 9–46)
AST: 39 U/L — ABNORMAL HIGH (ref 10–35)
Alkaline Phosphatase: 134 U/L — ABNORMAL HIGH (ref 40–115)
BILIRUBIN TOTAL: 0.9 mg/dL (ref 0.2–1.2)
Bilirubin, Direct: 0.3 mg/dL — ABNORMAL HIGH (ref <=0.2)
Indirect Bilirubin: 0.6 mg/dL (ref 0.2–1.2)
Total Protein: 5.8 g/dL — ABNORMAL LOW (ref 6.1–8.1)

## 2015-07-12 LAB — PROTIME-INR
INR: 1.38 (ref <1.50)
Prothrombin Time: 17.1 seconds — ABNORMAL HIGH (ref 11.6–15.2)

## 2015-07-12 LAB — CREATININE, SERUM: Creat: 1.6 mg/dL — ABNORMAL HIGH (ref 0.70–1.18)

## 2015-07-12 NOTE — Patient Instructions (Signed)
Labs today. OV in 3 months.  

## 2015-07-12 NOTE — Progress Notes (Signed)
Subjective:    Patient ID: Cody Schmitt, male    DOB: [birth year], 81 y.o.   MRN: [id number]  HPI Referred by Dr. Sherril Croon for cirrhosis. Wife states new diagnosis one year ago. Admitted to [facility] in 2016 and found to have cirrhosis and ammonia level was elevated. He was admitted for a few weeks. He did end up on a vent. He has had periods when his ammonia level is elevated. He has never been a heavy drinker.  His appetite is okay.  He has had weight gain which his wife states is fluid.  With the Lactulose TID he usually has four BMs a day.  He has never done IV drugs. No hx of having a blood transfusion. Family hx of pancreatic cancer. 10/24/2014 Acute Hepatitis Panel is negative. Recent hx of chronic kidney disease and will be seeing Dr. Fausto Skillern in 1 week.  Diabetic x 8 yrs. Last HA1C 1 months ago.  Placed on Demadex  by Dr. Fausto Skillern 2 months ago.    05/31/2015 H and H 11.8 AND 35.3, mcv 98, platelet ct 104 Glucose 166, BUN 55, Creatinine 2.26, K 3.9, Chloride 103, Ammonia 98 11.02/2015 Iron 105, IBC 244, UIBC 139, Iron saturation 43. 10/24/2014 AST 82, ALT 55, ALP 123, total bili 1.1, Protein 5.7. almumin 1.9  10/11/2014 US abdomen: Rt upper quadrant pain:   There is diffusely coarsened echotexture of the liver without focal lesion. There is no intrahepatic bile duct dilatation.  IMPRESSION: Cholelithiasis. Diffusely coarsened hepatic parenchymal echotexture, nonspecific.   Review of Systems Past Medical History  Diagnosis Date  . HTN (hypertension)   . Asthma   . Hepatitis C   . Dyslipidemia   . GERD (gastroesophageal reflux disease)   . Diabetes mellitus without complication (HCC)   . Cirrhosis (HCC)   . CKD (chronic kidney disease)     Past Surgical History  Procedure Laterality Date  . Cataract surgery    . Colonoscopy      1 yr ago [place], Normal. Dr. Roswell Miners    Allergies  Allergen Reactions  . Lisinopril Cough  . Statins Nausea And Vomiting  .  Zetia [Ezetimibe] Nausea And Vomiting  . Coumadin [Warfarin Sodium] Rash  . Dilaudid [Hydromorphone Hcl] Itching and Rash    Current Outpatient Prescriptions on File Prior to Visit  Medication Sig Dispense Refill  . albuterol (PROVENTIL HFA;VENTOLIN HFA) 108 (90 BASE) MCG/ACT inhaler Inhale 2 puffs into the lungs 2 (two) times daily.     Marland Kitchen arformoterol (BROVANA) 15 MCG/2ML NEBU Take 15 mcg by nebulization 2 (two) times daily.    Marland Kitchen aspirin EC 81 MG tablet Take 81 mg by mouth daily.    . Cholecalciferol (VITAMIN D) 2000 UNITS tablet Take 2,000 Units by mouth daily.    . clotrimazole-betamethasone (LOTRISONE) cream Apply 1 application topically 2 (two) times daily as needed (rash).     . lactulose (CHRONULAC) 10 GM/15ML solution Take 45 mLs (30 g total) by mouth 3 (three) times daily. 240 mL 0  . metoprolol succinate (TOPROL-XL) 25 MG 24 hr tablet Take 25 mg by mouth daily.    . mometasone (ASMANEX) 220 MCG/INH inhaler Inhale 2 puffs into the lungs daily.    Marland Kitchen omeprazole (PRILOSEC) 20 MG capsule Take 20 mg by mouth daily.    . rifaximin (XIFAXAN) 550 MG TABS tablet Take 1 tablet (550 mg total) by mouth 2 (two) times daily. 60 tablet 0   No current facility-administered medications on file prior to  visit.        Objective:   Physical Exam Blood pressure 138/68, pulse 68, temperature 98.5 F (36.9 C), height  (1.753 m), weight 285 lb 3.2 oz (129.366 kg). Alert and oriented. Skin warm and dry. Oral mucosa is moist.   . Sclera anicteric, conjunctivae is pink. Thyroid not enlarged. No cervical lymphadenopathy. Lungs clear. Heart regular rate and rhythm.  Abdomen is soft. Bowel sounds are positive. No hepatomegaly. No abdominal masses felt. No tenderness.  4+ edema to lower extremities. No scrotal edema noted. Abdomen appears distended. Abdominal varices noted and bruising noted.       Assessment & Plan:  Cirrhosis, NAFLD for now. Needs to rule out other causes of cirrhosis.  In the  near future, will need an EGD/possible banding.  Lasix  daily. CBC, Hepatic function, AFP, Alpha 1 antitrypsin, ANA, mitochondrial, Pt/INR, ammonia. Creatinine

## 2015-07-13 LAB — AFP TUMOR MARKER: AFP TUMOR MARKER: 5.8 ng/mL (ref <6.1)

## 2015-07-14 LAB — ANTI-NUCLEAR AB-TITER (ANA TITER): ANA Titer 1: 1:40 {titer} — ABNORMAL HIGH

## 2015-07-14 LAB — ANA: Anti Nuclear Antibody(ANA): POSITIVE — AB

## 2015-07-14 LAB — ANTI-SMOOTH MUSCLE ANTIBODY, IGG: Smooth Muscle Ab: <20 U

## 2015-07-14 LAB — MITOCHONDRIAL ANTIBODIES

## 2015-07-15 LAB — AMMONIA: Ammonia: 58 umol/L — ABNORMAL HIGH (ref 16–53)

## 2015-07-17 ENCOUNTER — Other Ambulatory Visit (INDEPENDENT_AMBULATORY_CARE_PROVIDER_SITE_OTHER): Payer: Self-pay | Admitting: Internal Medicine

## 2015-07-17 ENCOUNTER — Ambulatory Visit (HOSPITAL_COMMUNITY)
Admission: RE | Admit: 2015-07-17 | Discharge: 2015-07-17 | Disposition: A | Discharge status: Home / Self Care | Payer: Medicare Other | Source: Ambulatory Visit | Attending: Internal Medicine | Admitting: Internal Medicine

## 2015-07-17 DIAGNOSIS — I8500 Esophageal varices without bleeding: Secondary | ICD-10-CM | POA: Diagnosis not present

## 2015-07-17 DIAGNOSIS — B182 Chronic viral hepatitis C: Secondary | ICD-10-CM | POA: Insufficient documentation

## 2015-07-17 DIAGNOSIS — K766 Portal hypertension: Secondary | ICD-10-CM | POA: Diagnosis not present

## 2015-07-17 DIAGNOSIS — R161 Splenomegaly, not elsewhere classified: Secondary | ICD-10-CM | POA: Diagnosis not present

## 2015-07-17 DIAGNOSIS — K7460 Unspecified cirrhosis of liver: Secondary | ICD-10-CM | POA: Insufficient documentation

## 2015-07-17 DIAGNOSIS — K8020 Calculus of gallbladder without cholecystitis without obstruction: Secondary | ICD-10-CM | POA: Insufficient documentation

## 2015-07-17 DIAGNOSIS — R188 Other ascites: Secondary | ICD-10-CM | POA: Insufficient documentation

## 2015-07-17 MED ORDER — IOHEXOL 300 MG/ML  SOLN
80.0000 mL | Freq: Once | INTRAMUSCULAR | Status: AC | PRN
  Administered 2015-07-17: 80 mL via INTRAVENOUS

## 2015-08-08 ENCOUNTER — Other Ambulatory Visit (INDEPENDENT_AMBULATORY_CARE_PROVIDER_SITE_OTHER): Payer: Self-pay | Admitting: Internal Medicine

## 2015-08-08 DIAGNOSIS — K745 Biliary cirrhosis, unspecified: Secondary | ICD-10-CM

## 2015-08-09 ENCOUNTER — Encounter (INDEPENDENT_AMBULATORY_CARE_PROVIDER_SITE_OTHER): Payer: Self-pay | Admitting: *Deleted

## 2015-08-16 ENCOUNTER — Encounter (INDEPENDENT_AMBULATORY_CARE_PROVIDER_SITE_OTHER): Payer: Self-pay

## 2015-09-07 ENCOUNTER — Encounter (HOSPITAL_COMMUNITY): Admission: RE | Payer: Self-pay | Source: Ambulatory Visit

## 2015-09-07 ENCOUNTER — Ambulatory Visit (HOSPITAL_COMMUNITY): Admission: RE | Admit: 2015-09-07 | Payer: Medicare Other | Source: Ambulatory Visit | Admitting: Internal Medicine

## 2015-09-07 SURGERY — EGD (ESOPHAGOGASTRODUODENOSCOPY)
Anesthesia: Moderate Sedation

## 2015-10-09 DEATH — deceased

## 2015-10-10 ENCOUNTER — Ambulatory Visit (INDEPENDENT_AMBULATORY_CARE_PROVIDER_SITE_OTHER): Payer: Self-pay | Admitting: Internal Medicine

## 2016-02-06 ENCOUNTER — Encounter (INDEPENDENT_AMBULATORY_CARE_PROVIDER_SITE_OTHER): Payer: Self-pay

## 2016-04-12 IMAGING — RF DG FLUORO GUIDE LUMBAR PUNCTURE
2 series · 2 of 2 positions shown · non-contrast
Comparison: none

CLINICAL DATA: Fever and altered mental status

[Series 1: fluoro_iodine 2fps_bw · 0.18mm/px · 1 of 1 slices shown]
[im 1/1]
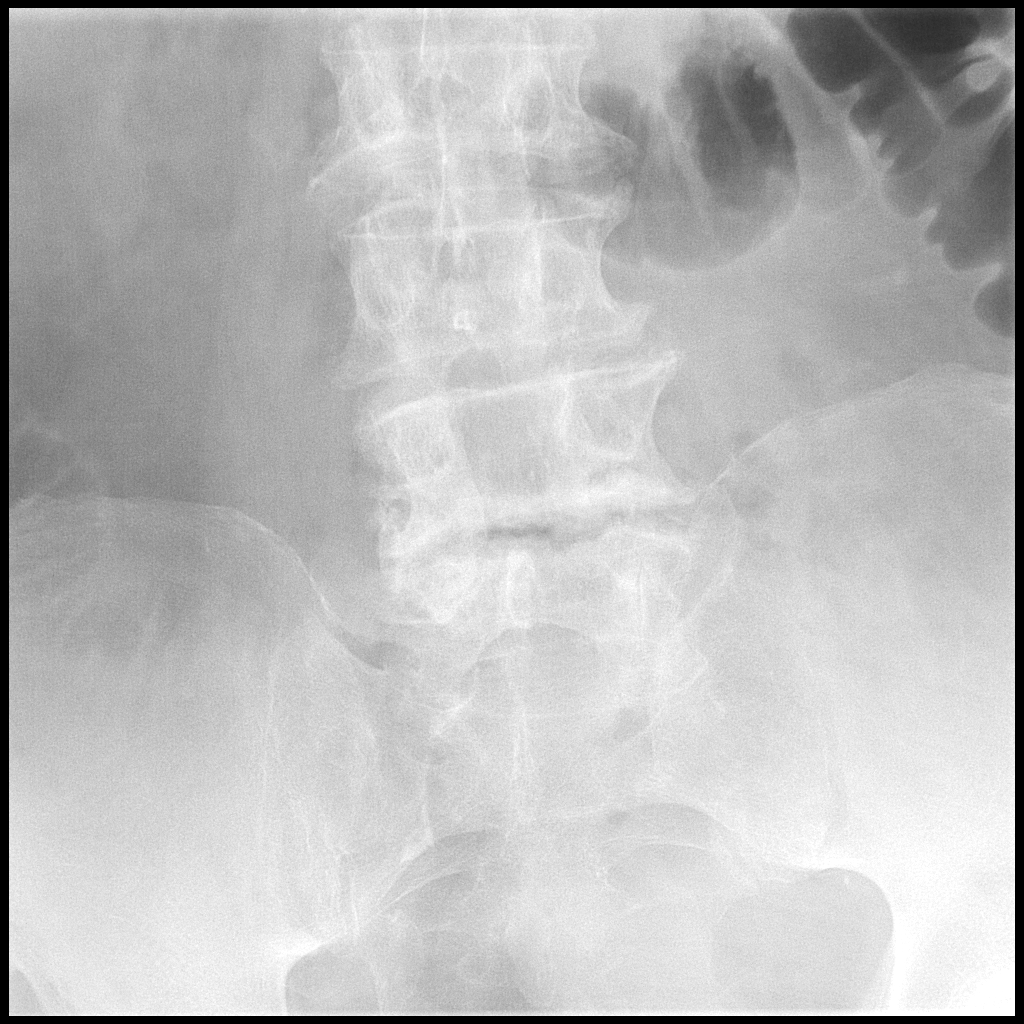

[Series 2: cp_standard · 0.17mm/px · 1 of 1 slices shown]
[im 1/1]
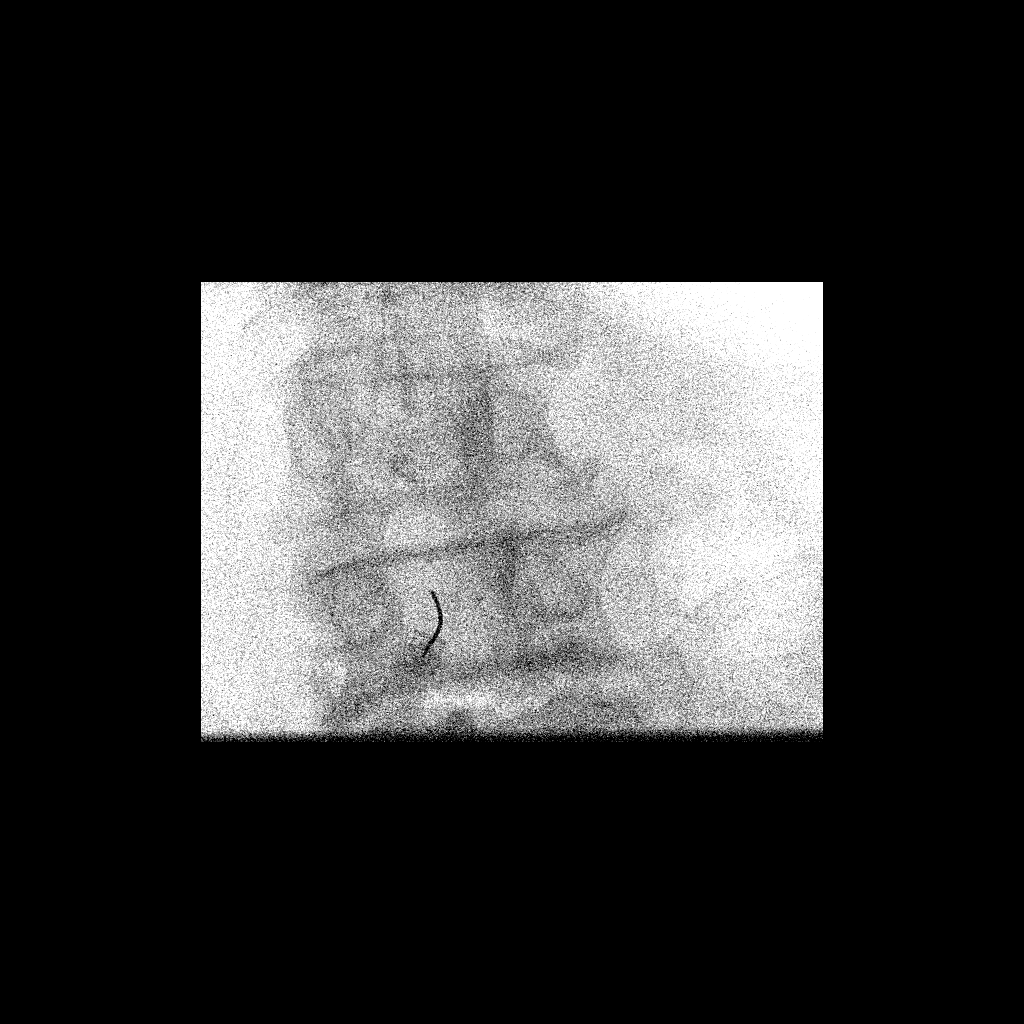

[2 of 2 positions shown; findings below may reference images not displayed]

EXAM:
DIAGNOSTIC LUMBAR PUNCTURE UNDER FLUOROSCOPIC GUIDANCE

FLUOROSCOPY TIME:  Radiation Exposure Index (as provided by the
fluoroscopic device): 18.10 mGy entrance does

If the device does not provide the exposure index:

Fluoroscopy Time (in minutes and seconds):  0.8 minutes

Number of Acquired Images:  2

PROCEDURE:
Informed consent was obtained from the patient's family the day
before the procedure. With the patient prone, the lower back was
prepped with Betadine. 1% Lidocaine was used for local anesthesia.
Lumbar puncture was performed at aL4 laminectomy defect level using
a 5 inch 20 gauge needle with return of clear CSF. Pressures could
not be reliably obtained due to patient intubation status and prone
positioning. 9 ml of CSF were obtained for laboratory studies. The
patient tolerated the procedure well and there were no apparent
complications.
IMPRESSION: Successful lumbar puncture with fluoroscopy.

## 2016-04-12 IMAGING — CR DG CHEST 1V PORT
1 series · 1 of 1 positions shown · non-contrast
Comparison: 10/13/2014

CLINICAL DATA: 79-year-old male with a history of aspiration
pneumonia.

EXAM:
PORTABLE CHEST - 1 VIEW

[AP]
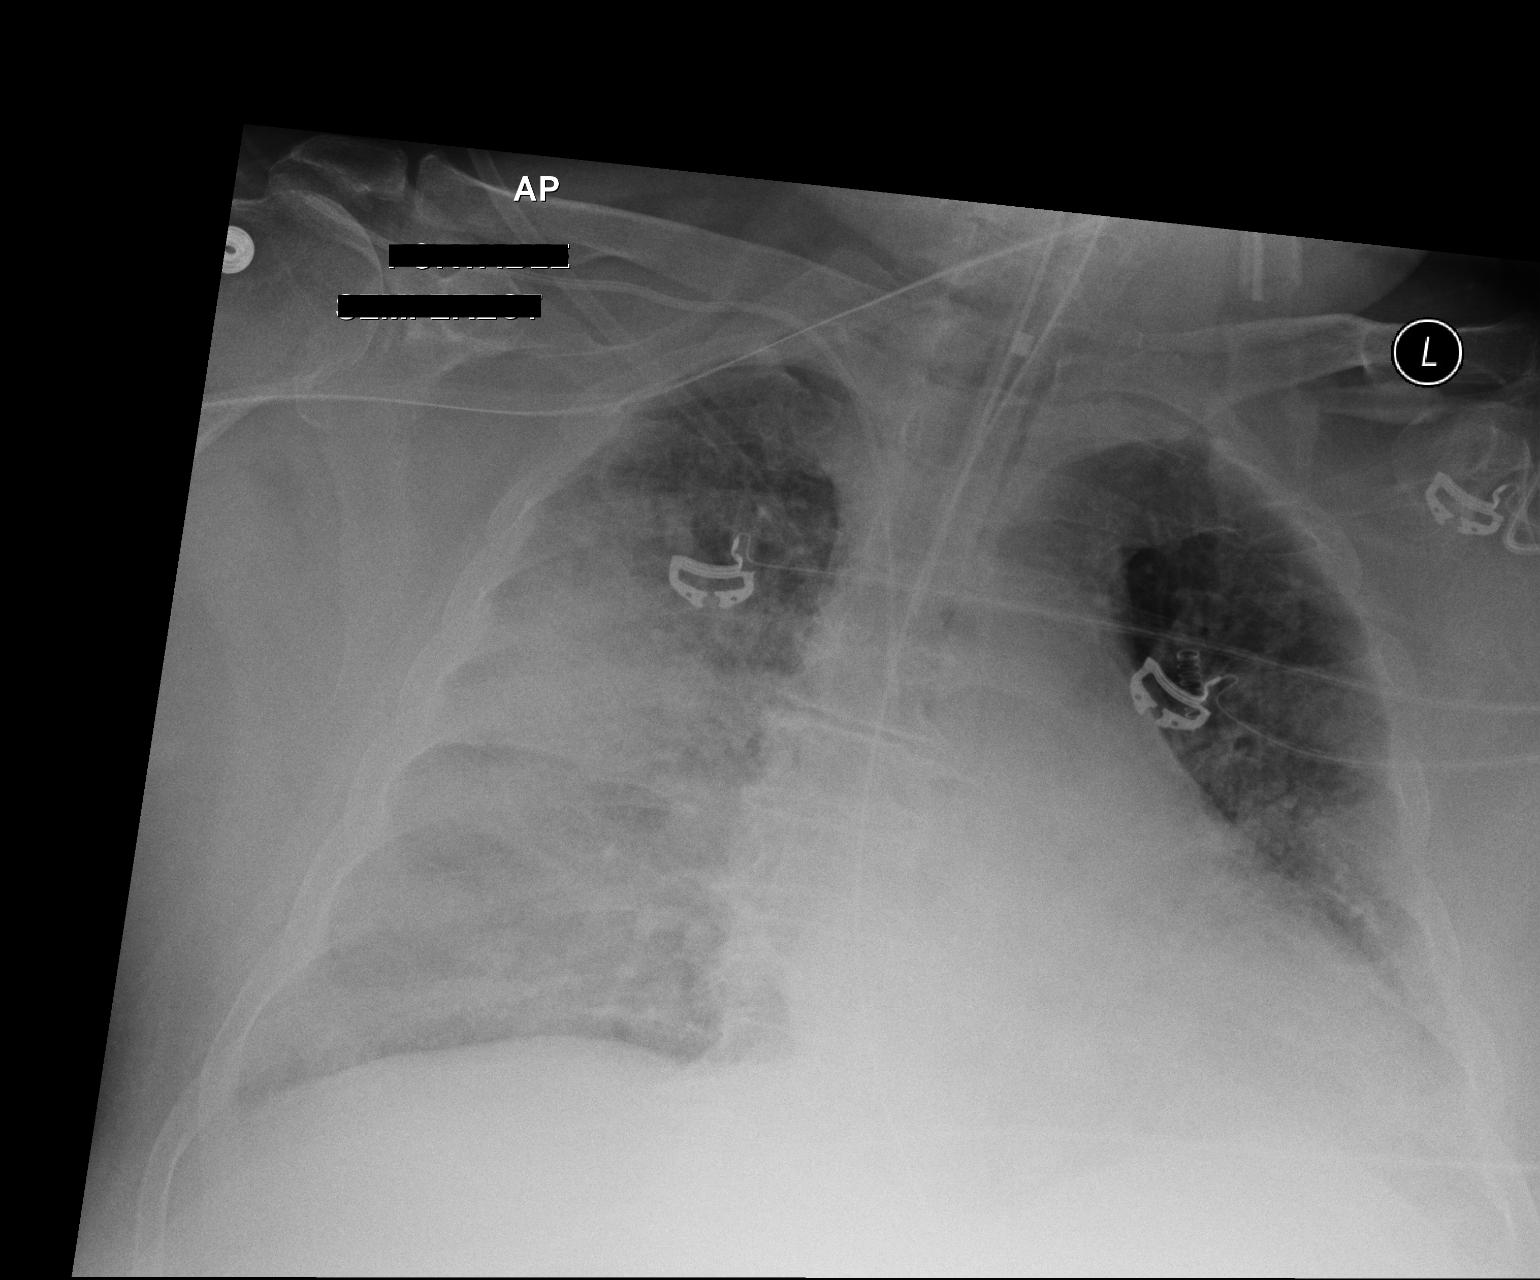

[1 of 1 positions shown; findings below may reference images not displayed]

FINDINGS: Persisting right-sided airspace disease with low lung volumes.
Persisting retrocardiac opacity.

Cardiomediastinal silhouette not well evaluated.

Endotracheal tube terminates approximately 1 cm from the carina.
This appears to have been advanced from the comparison.

Unchanged right subclavian central catheter which appears to
terminate superior vena cava.

Enteric tube projects over the mediastinum, terminating out of the
field of view.
IMPRESSION: Low lung volumes with persisting right-sided airspace disease.

Endotracheal tube terminates proximally 1 cm from the carina. This
may be withdrawn 4 cm to 5 cm for better position.

These results were called by telephone at the time of interpretation
on 10/14/2014 at [DATE] to the nurse caring for the patient, Ms.
Nomasibulele Moatshe who verbally acknowledged these results.

Unchanged right subclavian central line and enteric tube.

## 2016-04-13 IMAGING — CR DG CHEST 1V PORT
2 series · 2 of 2 positions shown · non-contrast
Comparison: 10/14/2014

CLINICAL DATA: Aspiration pneumonia

EXAM:
PORTABLE CHEST - 1 VIEW

[AP (1 of 2)]
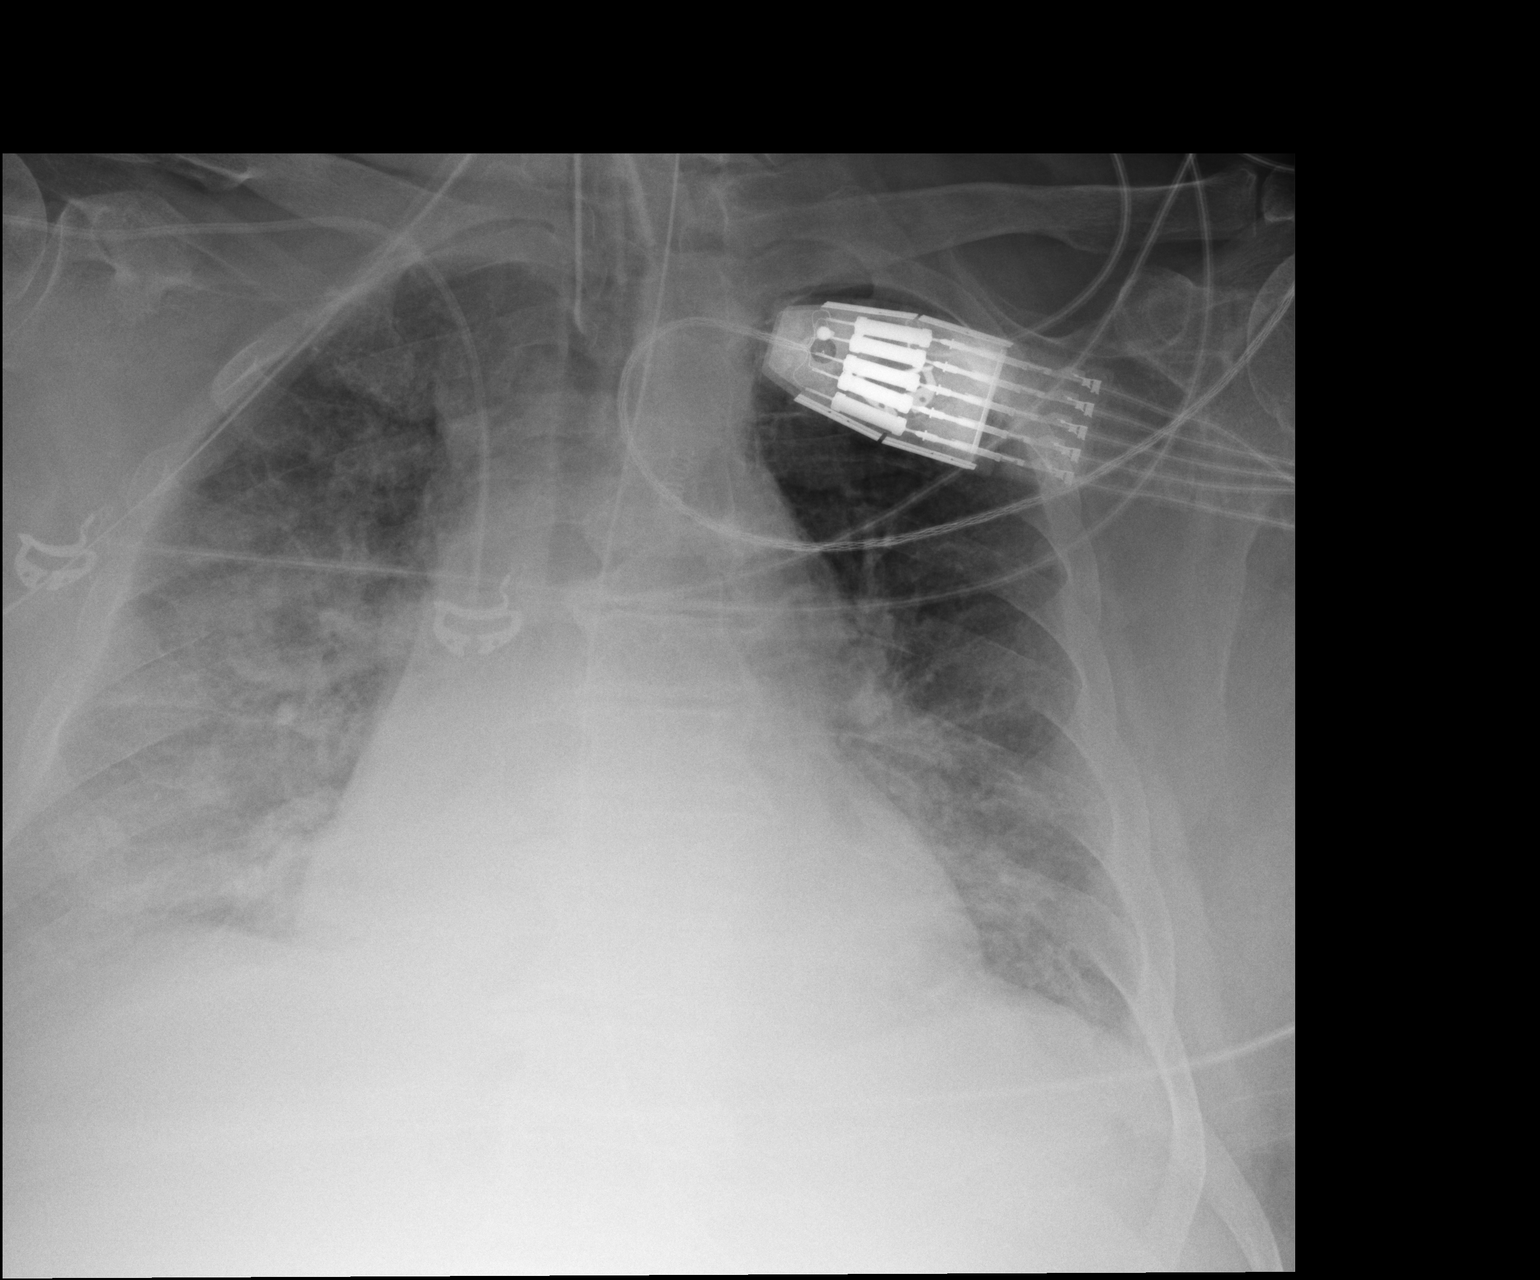

[AP (2 of 2)]
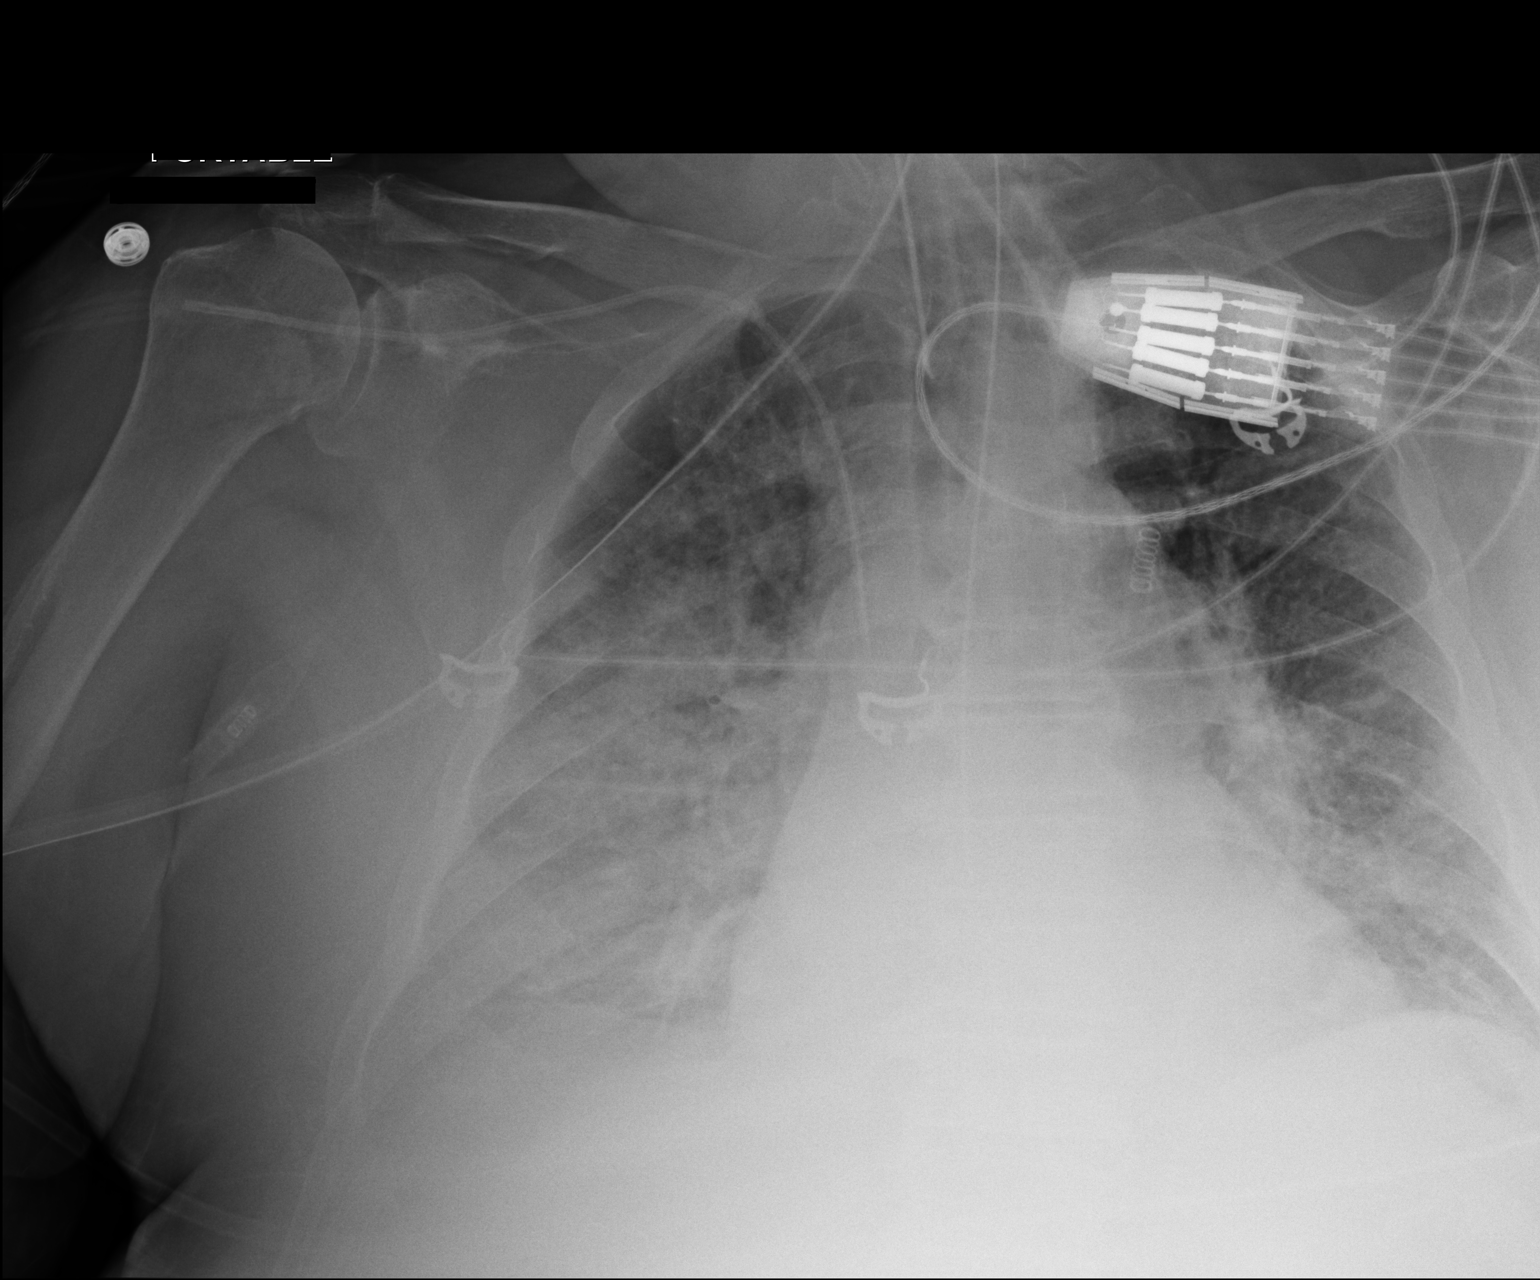

[2 of 2 positions shown; findings below may reference images not displayed]

FINDINGS: Endotracheal tube tip is 7.9 cm above the carina. Nasogastric tube
extends below the diaphragm and off the inferior edge of the image.
There is a right subclavian central line with tip in the SVC. Right
upper lobe consolidation is slightly improved, now with less densely
confluent consolidation. There is unchanged mild airspace opacity in
both bases. No large effusions are evident.
IMPRESSION: Partial clearance of right upper lobe consolidation. Support
equipment appears satisfactorily positioned.
